# Patient Record
Sex: Female | Born: 1950 | Race: Black or African American | Hispanic: No | Smoking: Never smoker
Health system: Southern US, Community
[De-identification: ages and names within clinical notes are randomized; demographics above are authoritative.]

## PROBLEM LIST (undated history)

## (undated) DIAGNOSIS — F32A Depression, unspecified: Secondary | ICD-10-CM

## (undated) DIAGNOSIS — F329 Major depressive disorder, single episode, unspecified: Secondary | ICD-10-CM

## (undated) DIAGNOSIS — N200 Calculus of kidney: Secondary | ICD-10-CM

## (undated) DIAGNOSIS — M8580 Other specified disorders of bone density and structure, unspecified site: Secondary | ICD-10-CM

## (undated) DIAGNOSIS — I10 Essential (primary) hypertension: Secondary | ICD-10-CM

## (undated) DIAGNOSIS — D649 Anemia, unspecified: Secondary | ICD-10-CM

## (undated) HISTORY — DX: Depression, unspecified: F32.A

## (undated) HISTORY — DX: Essential (primary) hypertension: I10

## (undated) HISTORY — DX: Other specified disorders of bone density and structure, unspecified site: M85.80

## (undated) HISTORY — DX: Major depressive disorder, single episode, unspecified: F32.9

## (undated) HISTORY — PX: LITHOTRIPSY: SUR834

## (undated) HISTORY — DX: Anemia, unspecified: D64.9

## (undated) HISTORY — DX: Calculus of kidney: N20.0

---

## 2008-07-22 ENCOUNTER — Ambulatory Visit (HOSPITAL_COMMUNITY): Admission: RE | Admit: 2008-07-22 | Discharge: 2008-07-22 | Payer: Self-pay | Admitting: Urology

## 2008-08-26 ENCOUNTER — Ambulatory Visit (HOSPITAL_COMMUNITY): Admission: RE | Admit: 2008-08-26 | Discharge: 2008-08-26 | Payer: Self-pay | Admitting: Urology

## 2008-08-26 IMAGING — CR DG ABDOMEN 1V
1 series · 1 of 1 positions shown · non-contrast
Comparison: [DATE]

CLINICAL DATA: Pre lithotripsy.  Right-sided proximal stone.

ABDOMEN - 1 VIEW

[t abdomen supine]
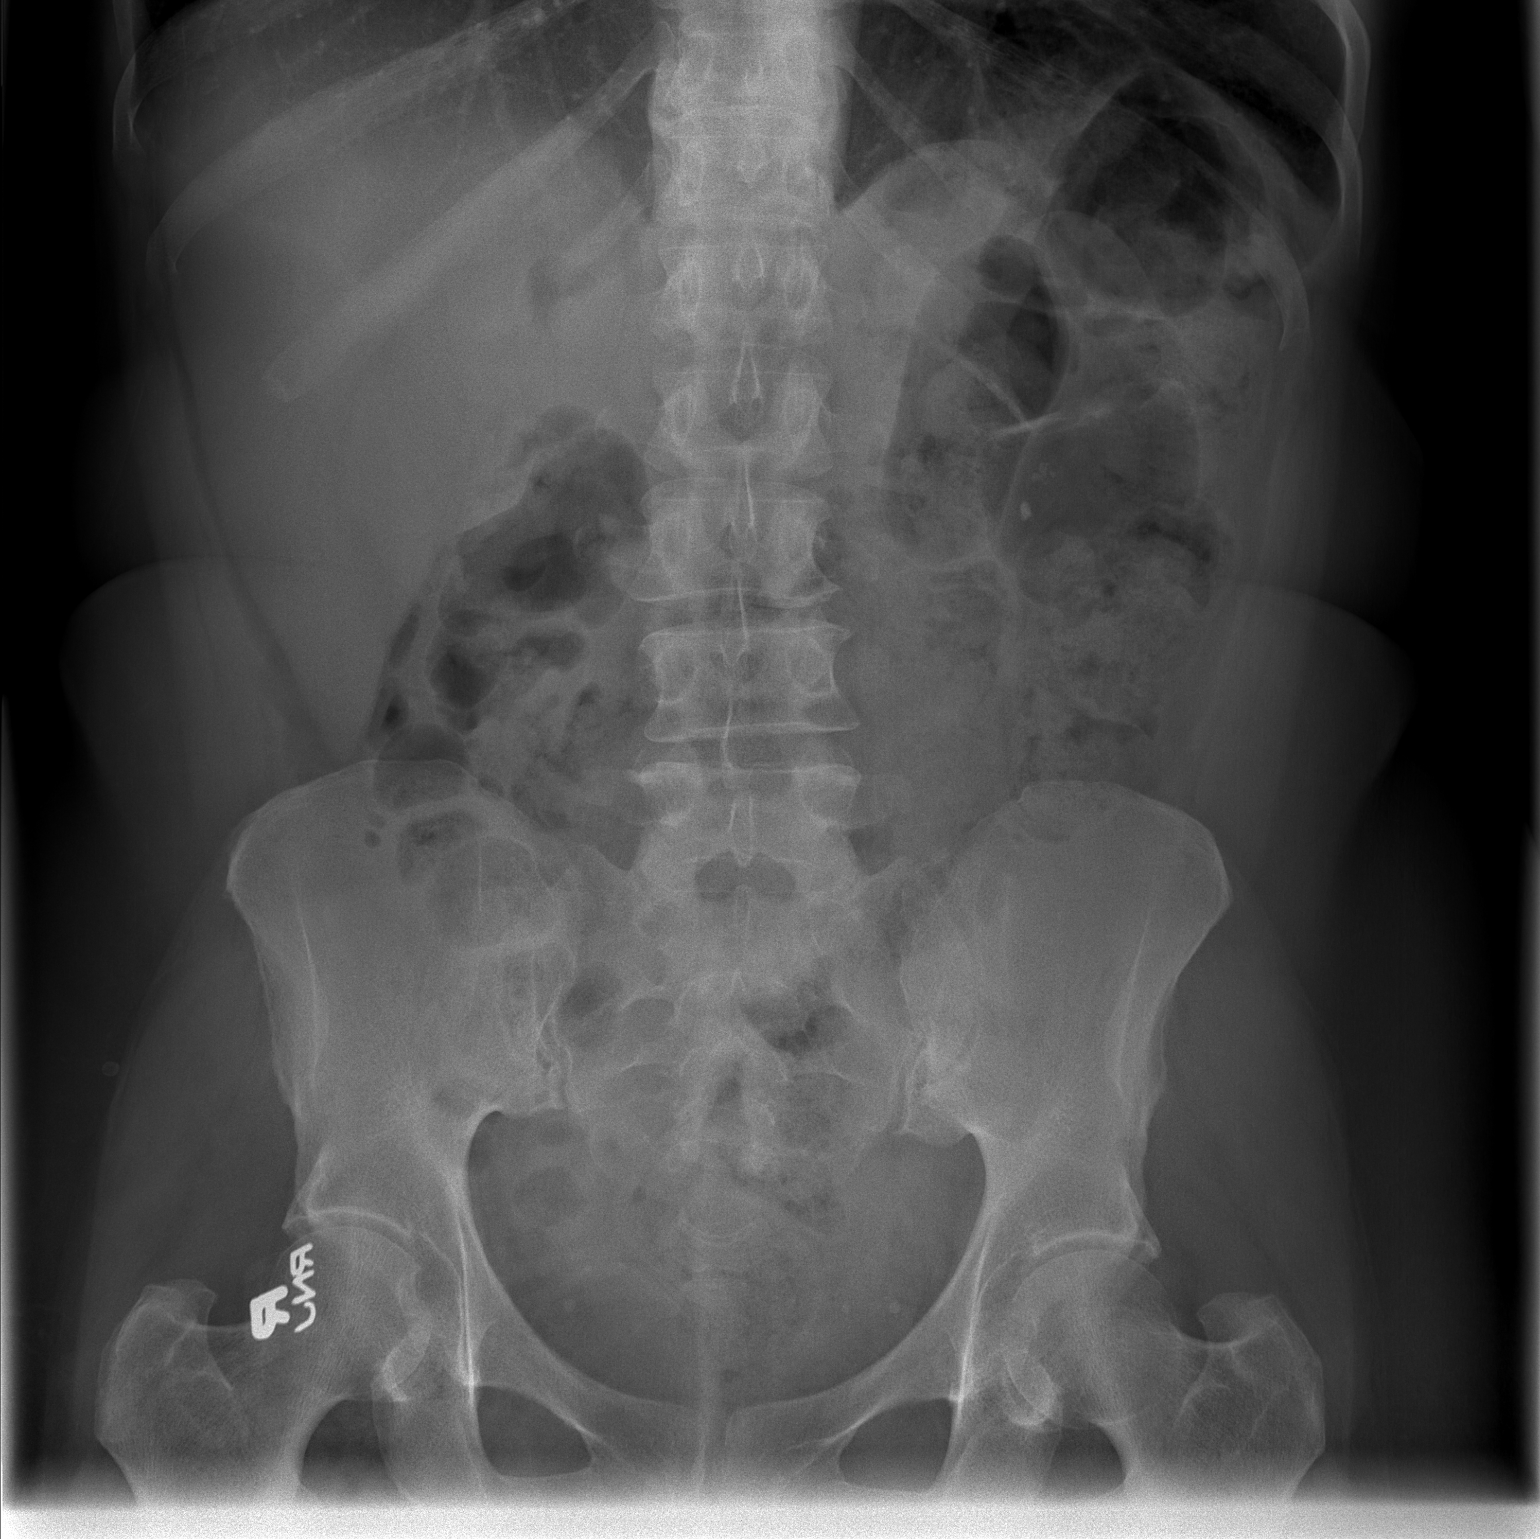

[1 of 1 positions shown; findings below may reference images not displayed]

FINDINGS: Single AP view of the abdomen and pelvis.  Nonobstructive
bowel gas pattern.  There is a large amount of bowel gas overlying
the lower pole of the right kidney and entire left kidney.  The
previously described dense right UPJ calculus is not readily
apparent.  The smaller calculi projecting over the right renal
collecting system are likely present but less well visualized.
There is vague increased density over the right transverse process
of L3 which may represent a proximal ureteric calculus.

Phleboliths identified within the pelvis.  Numerous small stones
over the inter/lower pole left kidney.
IMPRESSION: Extensive bowel gas over the kidneys bilaterally which limits
evaluation.  The dominant stone projecting over the right UPJ on
the prior exam is not readily identified.  Smaller collecting
system calculi are likely similar.

## 2008-09-10 ENCOUNTER — Encounter: Admission: RE | Admit: 2008-09-10 | Discharge: 2008-09-10 | Payer: Self-pay | Admitting: Family Medicine

## 2008-09-10 LAB — HM MAMMOGRAPHY: HM Mammogram: NORMAL

## 2010-02-24 LAB — HM PAP SMEAR: HM Pap smear: NORMAL

## 2010-09-06 ENCOUNTER — Encounter: Payer: Self-pay | Admitting: Family Medicine

## 2010-09-06 DIAGNOSIS — I10 Essential (primary) hypertension: Secondary | ICD-10-CM | POA: Insufficient documentation

## 2010-09-06 DIAGNOSIS — N200 Calculus of kidney: Secondary | ICD-10-CM | POA: Insufficient documentation

## 2012-10-20 ENCOUNTER — Encounter: Payer: Self-pay | Admitting: Physician Assistant

## 2012-10-20 ENCOUNTER — Telehealth: Payer: Self-pay | Admitting: Gastroenterology

## 2012-10-20 ENCOUNTER — Ambulatory Visit (INDEPENDENT_AMBULATORY_CARE_PROVIDER_SITE_OTHER): Payer: BC Managed Care – PPO | Admitting: Physician Assistant

## 2012-10-20 VITALS — BP 132/76 | HR 88 | Temp 99.4°F | Resp 18 | Wt 178.0 lb

## 2012-10-20 DIAGNOSIS — N390 Urinary tract infection, site not specified: Secondary | ICD-10-CM

## 2012-10-20 DIAGNOSIS — I10 Essential (primary) hypertension: Secondary | ICD-10-CM

## 2012-10-20 DIAGNOSIS — R42 Dizziness and giddiness: Secondary | ICD-10-CM

## 2012-10-20 DIAGNOSIS — R1013 Epigastric pain: Secondary | ICD-10-CM

## 2012-10-20 DIAGNOSIS — R197 Diarrhea, unspecified: Secondary | ICD-10-CM

## 2012-10-20 DIAGNOSIS — D649 Anemia, unspecified: Secondary | ICD-10-CM

## 2012-10-20 DIAGNOSIS — R110 Nausea: Secondary | ICD-10-CM

## 2012-10-20 LAB — COMPLETE METABOLIC PANEL WITH GFR
ALT: 89 U/L — ABNORMAL HIGH (ref 0–35)
AST: 58 U/L — ABNORMAL HIGH (ref 0–37)
Albumin: 3.6 g/dL (ref 3.5–5.2)
Alkaline Phosphatase: 198 U/L — ABNORMAL HIGH (ref 39–117)
BUN: 8 mg/dL (ref 6–23)
CO2: 23 mEq/L (ref 19–32)
Calcium: 8.8 mg/dL (ref 8.4–10.5)
Chloride: 102 mEq/L (ref 96–112)
Creat: 1 mg/dL (ref 0.50–1.10)
GFR, Est African American: 70 mL/min
GFR, Est Non African American: 61 mL/min
Glucose, Bld: 126 mg/dL — ABNORMAL HIGH (ref 70–99)
Potassium: 3.8 mEq/L (ref 3.5–5.3)
Sodium: 136 mEq/L (ref 135–145)
Total Bilirubin: 0.8 mg/dL (ref 0.3–1.2)
Total Protein: 6.3 g/dL (ref 6.0–8.3)

## 2012-10-20 LAB — CBC W/MCH & 3 PART DIFF
HCT: 27.7 % — ABNORMAL LOW (ref 36.0–46.0)
Hemoglobin: 8.9 g/dL — CL (ref 12.0–15.0)
Lymphocytes Relative: 17 % (ref 12–46)
Lymphs Abs: 1.7 10*3/uL (ref 0.7–4.0)
MCH: 29.6 pg (ref 26.0–34.0)
MCHC: 32.1 g/dL (ref 30.0–36.0)
MCV: 92 fL (ref 78.0–100.0)
Neutro Abs: 7.1 10*3/uL (ref 1.7–7.7)
Neutrophils Relative %: 74 % (ref 43–77)
Platelets: 216 10*3/uL (ref 150–400)
RBC: 3.01 MIL/uL — ABNORMAL LOW (ref 3.87–5.11)
RDW: 13.5 % (ref 11.5–15.5)
WBC mixed population %: 9 % (ref 3–18)
WBC mixed population: 0.9 10*3/uL (ref 0.1–1.8)
WBC: 9.7 10*3/uL (ref 4.0–10.5)

## 2012-10-20 MED ORDER — OMEPRAZOLE 20 MG PO CPDR: 20.0000 mg | DELAYED_RELEASE_CAPSULE | Freq: Every day | ORAL | Status: DC

## 2012-10-20 MED ORDER — PROMETHAZINE HCL 25 MG PO TABS: 25.0000 mg | ORAL_TABLET | Freq: Three times a day (TID) | ORAL | Status: DC | PRN

## 2012-10-20 MED ORDER — SODIUM CHLORIDE 0.9 % IV SOLN
INTRAVENOUS | Status: DC
  Administered 2012-10-20: 1000 mL via INTRAVENOUS

## 2012-10-20 NOTE — Telephone Encounter (Signed)
Patient will come in tomorrow with Doug Sou, PA at 9:30

## 2012-10-20 NOTE — Progress Notes (Signed)
Patient ID: KIRAH STICE MRN: [id number], DOB: 06-25-1950, 62 y.o. Date of Encounter: @DATE @  Chief Complaint:  Chief Complaint  Patient presents with  . seen in [facility] Saturday,  Dx UTI, anemia, dehydrated    getting worse,  stung by multiple bees last week  question if all this stemmming from that    HPI: 62 y.o. year old AA female  presents with her daughter for followup. She went to the urgent care in [place] on Saturday 10/18/2012.  At that time she reported vomiting diarrhea dark urine.  Her abdominal pain had started sudden onset 3 days prior which would've made at 10/15/2012. Since then she had had some burning pain in her upper abdomen. As well she has noticed dark urine diarrhea fever nausea and vomiting.  She reported a history of anemia and reported that she was supposed to be taking iron pills but was not. She reported no hematochezia and no melena.  Today I reviewed all of this history with her and the daughter and may agree with the above.  At the urgent care on 10/18/12 the following tests were done: CBC: WBC 12.7 Hemoglobin 9.7 hematocrit 30.6 platelet normal. MCV normal at 92. MCH normal at 29.1. C. met was normal including a potassium of 3.6 and a BUN of 16 A urine was cloudy this showed moderate blood. There was positive nitrites and trace leukocytes.  Hemoccult was negative.  She was treated at the urgent care with 1 g of IM Rocephin. She was given a prescription for Cipro for 5 days to start the following day. As well she was given a sublingual Phenergan. The urgent care but apparently not a prescription of nausea medicine for home use. Today she reports that she is still felt very nauseous but has not actually vomited any since being at urgent care. However she's continued to have diarrhea. She was "up all night "with repetitive diarrhea. She is feeling weak and feeling lightheaded and presyncope. She states that prior to going to the urgent care  she vomited 4-5 times.   Past Medical History  Diagnosis Date  . Hypertension   . Depression   . Nephrolithiasis      Home Meds: See attached medication section for current medication list. Any medications entered into computer today will not appear on this note's list. The medications listed below were entered prior to today. Current Outpatient Prescriptions on File Prior to Visit  Medication Sig Dispense Refill  . amLODipine (NORVASC) 5 MG tablet Take 5 mg by mouth daily.        . citalopram (CELEXA) 20 MG tablet Take 20 mg by mouth daily.        . clonazePAM (KLONOPIN) 0.5 MG tablet Take 0.5 mg by mouth 2 (two) times daily as needed.        . hydrochlorothiazide 25 MG tablet Take 25 mg by mouth daily.         No current facility-administered medications on file prior to visit.    Allergies:  Allergies  Allergen Reactions  . Benazepril     History   Social History  . Marital Status: Married    Spouse Name: N/A    Number of Children: N/A  . Years of Education: N/A   Occupational History  . Not on file.   Social History Main Topics  . Smoking status: Not on file  . Smokeless tobacco: Not on file  . Alcohol Use: Not on file  . Drug Use:  Not on file  . Sexual Activity: Not on file   Other Topics Concern  . Not on file   Social History Narrative  . No narrative on file    No family history on file.   Review of Systems:  See HPI for pertinent ROS. All other ROS negative.    Physical Exam: Blood pressure 132/76, pulse 88, temperature 99.4 F (37.4 C), temperature source Oral, resp. rate 18, weight 178 lb (80.74 kg)., There is no height on file to calculate BMI. General: Well-nourished well-developed female appears in no acute distress .Appears in no acute distress. Lungs: Clear bilaterally to auscultation without wheezes, rales, or rhonchi. Breathing is unlabored. Heart: RRR with S1 S2. No murmurs, rubs, or gallops. Abdomen: Soft, non-distended with  normoactive bowel sounds. No hepatomegaly. No rebound/guarding. No obvious abdominal masses. There is tenderness with palpation across the entire upper abdomen but most significant in the mid epigastric area. Musculoskeletal:  Strength and tone normal for age. Extremities/Skin: Warm and dry. No clubbing or cyanosis. No edema. No rashes or suspicious lesions. Neuro: Alert and oriented X 3. Moves all extremities spontaneously. Gait is normal. CNII-XII grossly in tact. Psych:  Responds to questions appropriately with a normal affect. Digital Rectal Exam: Normal.  Hemoocult: NEGATIVE   Results for orders placed in visit on 10/20/12  CBC [facility] & 3 PART DIFF      Result Value Range   WBC 9.7  4.0 - 10.5 K/uL   RBC 3.01 (*) 3.87 - 5.11 MIL/uL   Hemoglobin 8.9 (*) 12.0 - 15.0 g/dL   HCT 96.0 (*) 45.4 - 09.8 %   MCV 92.0  78.0 - 100.0 fL   MCH 29.6  26.0 - 34.0 pg   MCHC 32.1  30.0 - 36.0 g/dL   RDW 11.9  14.7 - 82.9 %   Platelets 216  150 - 400 K/uL   Neutrophils Relative % 74  43 - 77 %   Neutro Abs 7.1  1.7 - 7.7 K/uL   Lymphocytes Relative 17  12 - 46 %   Lymphs Abs 1.7  0.7 - 4.0 K/uL   WBC mixed population % 9  3 - 18 %   WBC mixed population 0.9  0.1 - 1.8 K/uL  COMPLETE METABOLIC PANEL WITH GFR      Result Value Range   Sodium    135 - 145 mEq/L   Potassium    3.5 - 5.3 mEq/L   Chloride    96 - 112 mEq/L   CO2    19 - 32 mEq/L   Glucose, Bld    70 - 99 mg/dL   BUN    6 - 23 mg/dL   Creat    5.62 - 1.30 mg/dL   Total Bilirubin    0.3 - 1.2 mg/dL   Alkaline Phosphatase    39 - 117 U/L   AST    0 - 37 U/L   ALT    0 - 53 U/L   Total Protein    6.0 - 8.3 g/dL   Albumin    3.5 - 5.2 g/dL   Calcium    8.4 - 86.5 mg/dL   GFR, Est African American       GFR, Est Non African American         ASSESSMENT AND PLAN:  62 y.o. year old female with  1. Anemia Review of her chart. There is no prior CBC in the Epic computer system. The last CBC  and her paper chart is from 06/01/2008. At  that time her hemoglobin was 10.7 hematocrit 34.0. MCV and MCH were normal at that time. Anemia panel was done at that time showing normal iron levels and normal B12 and folate. Her Hemoccult tests were negative x3 there is no CBC since that date.  Given her epigastric pain we'll recheck a CBC white now to make sure hemoglobin is stable compared to the reading at the urgent care. However I think her epigastric pain may just be secondary to her recent vomiting. - CBC w/MCH & 3 Part Diff  Hemoglobin and hematocrit at the urgent care were 9.11/30.6. Today they are reading 8.9/27.7. Hemoccult was negative at the urgent care and is negative again today. However I am concerned about his anemia and especially the fact that it is worsening. We'll start proton pump inhibitor as above we'll also go ahead and refer to GI. She has never seen a GI before but her daughter has seen Dr. Arlyce Dice L. of our GI and prefers that pt go there/ Wil refer thaer on urgent bsis   2. Hypertension She is supposed to be on HCTZ in addition to Norvasc. However she realizes that she is accidentally been off of the HCTZ. I have told her to stay off of this until all the symptoms resolved.  3. UTI (urinary tract infection) She was given 1 g Rocephin at the urgent care. She is now on oral Cipro. Complete this. We'll recheck CBC to make sure that this is stable or improving - CBC w/MCH & 3 Part Diff  4. Abdominal pain, epigastric Will check labs as above. We'll go ahead and treat with PPI. Avoid any NSAIDs. Use Phenergan as needed. - CBC w/MCH & 3 Part Diff - COMPLETE METABOLIC PANEL WITH GFR - omeprazole (PRILOSEC) 20 MG capsule; Take 1 capsule (20 mg total) by mouth daily.  Dispense: 30 capsule; Refill: 3 - promethazine (PHENERGAN) 25 MG tablet; Take 1 tablet (25 mg total) by mouth every 8 (eight) hours as needed for nausea.  Dispense: 20 tablet; Refill: 0  5. Nausea alone She did have 5 episodes of vomiting at the beginning  of this but has had no vomiting since being at the urgent care on 10/18/2012. - CBC w/MCH & 3 Part Diff - COMPLETE METABOLIC PANEL WITH GFR - promethazine (PHENERGAN) 25 MG tablet; Take 1 tablet (25 mg total) by mouth every 8 (eight) hours as needed for nausea.  Dispense: 20 tablet; Refill: 0  6. Diarrhea She is continuing to have multiple episodes of diarrhea. We'll recheck labs to make sure these are stable. We'll treat with IV fluids now to you replete volume loss. - CBC w/MCH & 3 Part Diff - COMPLETE METABOLIC PANEL WITH GFR  7. Lightheadedness We'll treat with IV fluids to replete volume loss or diarrhea. - COMPLETE METABOLIC PANEL WITH GFR  Was negative at the urgent care. We'll recheck CBC and cemented now. Will treat with IV fluids. We'll give Phenergan and proton pump inhibitor. Avoid NSAIDs. She must stay on a clear liquid diet for 48 hours and then advance to a bland diet of crackers and toes. The daughter reports that yesterday the patient ate beef liver. I discussed that her intestines and stomach are  irritatated and inflamed and she must avoid eating solid foods that have to be digested.  Signed, [address] [place], [place], [facility] 10/20/2012 9:12 AM

## 2012-10-21 ENCOUNTER — Encounter: Payer: Self-pay | Admitting: Internal Medicine

## 2012-10-21 ENCOUNTER — Encounter: Payer: Self-pay | Admitting: *Deleted

## 2012-10-21 ENCOUNTER — Telehealth: Payer: Self-pay | Admitting: Nurse Practitioner

## 2012-10-21 ENCOUNTER — Ambulatory Visit (INDEPENDENT_AMBULATORY_CARE_PROVIDER_SITE_OTHER): Payer: BC Managed Care – PPO | Admitting: Nurse Practitioner

## 2012-10-21 VITALS — BP 132/68 | HR 74 | Ht 62.0 in | Wt 179.0 lb

## 2012-10-21 DIAGNOSIS — D649 Anemia, unspecified: Secondary | ICD-10-CM

## 2012-10-21 DIAGNOSIS — R630 Anorexia: Secondary | ICD-10-CM | POA: Insufficient documentation

## 2012-10-21 DIAGNOSIS — R195 Other fecal abnormalities: Secondary | ICD-10-CM

## 2012-10-21 MED ORDER — MOVIPREP 100 G PO SOLR: 1.0000 | Freq: Once | ORAL | Status: DC

## 2012-10-21 NOTE — Progress Notes (Addendum)
HPI :  Patient is a 62 year old female referred for evaluation of anemia. She was evaluated at an urgent center in [place], [place] 10/18/12 for gastroenteritis type symptoms  Her white count was mildly elevated at 12.7, hgb 9.7. Alk phos was normal but bili elevated at 2.4 (0.9 direct), AST 49 and ALT elevated at 110. Urinalysis with positive nitrites, moderate blood, and trace leukocytes. Stool for blood was negative.  She was given IV fluids, IM Rocephin, and a prescription for cipro and zofran. Her GI symptoms have improved. She is on a soft diet now. Patient saw her PCP in followup yesterday. Repeat CBC pertinent for hemoglobin of 8.9. Her white count had normalized. LFTs improved with AST 58, ALT 89. Total bilirubin down to 0.8. Alk phos now elevated at 198. She was heme negative on PCP's rectal exam.  Patient has no in EPIC other than yesterday's labs.We did call PCP and in 2010 her hemoglobin was 10.7. B12 and folate normal. Ferritin was 279. TIBC normal at 278 with 26% saturation.  Patient reports a poor appetite over last 2 months but no weight loss. No nausea. She does give a history of IBS with alternating constipation and diarrhea depending on diet but her BMs are at baseline. No overt bleeding. No GERD. She takes Ibuprofen daily for back pain.    Past Medical History  Diagnosis Date  . Hypertension   . Depression   . Nephrolithiasis   . Anemia     Family History  Problem Relation Age of Onset  . Cerebrovascular Accident     History  Substance Use Topics  . Smoking status: Never Smoker   . Smokeless tobacco: Never Used  . Alcohol Use: No   Current Outpatient Prescriptions  Medication Sig Dispense Refill  . amLODipine (NORVASC) 5 MG tablet Take 5 mg by mouth daily.        . ciprofloxacin (CIPRO) 250 MG tablet Take 250 mg by mouth 2 (two) times daily.      Marland Kitchen omeprazole (PRILOSEC) 20 MG capsule Take 1 capsule (20 mg total) by mouth daily.  30 capsule  3  . promethazine  (PHENERGAN) 25 MG tablet Take 1 tablet (25 mg total) by mouth every 8 (eight) hours as needed for nausea.  20 tablet  0  . citalopram (CELEXA) 20 MG tablet Take 20 mg by mouth daily.        . clonazePAM (KLONOPIN) 0.5 MG tablet Take 0.5 mg by mouth 2 (two) times daily as needed.        . hydrochlorothiazide 25 MG tablet Take 25 mg by mouth daily.         Current Facility-Administered Medications  Medication Dose Route Frequency Provider Last Rate Last Dose  . 0.9 %  sodium chloride infusion   Intravenous Continuous Dorena Bodo, PA-C 150 mL/hr at 10/20/12 0941 1,000 mL at 10/20/12 0941   Allergies  Allergen Reactions  . Benazepril     Review of Systems: Positive for anxiety, arthritis, back pain and urine leakage  All other  systems reviewed and negative except where noted in HPI.   Physical Exam: BP 132/68  Pulse 74  Ht 5\' 2"  (1.575 m)  Wt 179 lb (81.194 kg)  BMI 32.73 kg/m2 Constitutional: Pleasant,well-developed, black female in no acute distress. HEENT: Normocephalic and atraumatic. Conjunctivae are normal. No scleral icterus. Neck supple.  Cardiovascular: Normal rate, regular rhythm.  Pulmonary/chest: Effort normal and breath sounds normal. No wheezing, rales or rhonchi. Abdominal: Soft,  nondistended, nontender. Bowel sounds active throughout. There are no masses palpable. No hepatomegaly. Extremities: no edema Lymphadenopathy: No cervical adenopathy noted. Neurological: Alert and oriented to person place and time. Skin: Skin is warm and dry. No rashes noted. Psychiatric: Normal mood and affect. Behavior is normal.   ASSESSMENT AND PLAN:  34. 62 year old female with acute on chronic normocytic anemia. Patient heme negative on two recent occasions but given NSAID use it is reasonable to proceed with an EGD. She has never had a screening colonoscopy so will proceed with that as well.  The risk and benefits of these procedures which could include biopsies/polypectomy were  discussed with the patient and she agrees to proceed. Until time of procedure advised patient to discontinue ibuprofen and continue PPI before breakfast as prescribed by PCP yesterday.  If EGD/ colonoscopy negative consider hemolysis workup.   2. abnormal LFTs. Mild elevation of direct and indirect bili and elevated transaminases. Consider hemolysis with moderate urobilinogen in urine. LFTs improved except for alk phos which interestingly has risen from normal to 198. Of note LFTs were normal in 2010.  Patient should have repeat LFTs in two weeks,  If still abnormal further workup will be needed.   3. ? UTI. Diagnosed in [place]. On Cipro. Nitrites were positive though if dipstick method used nitrites may be falsely positive secondary to discolored urine (urine was orange).    Addendum: Reviewed and agree with initial management. Beverley Fiedler, MD

## 2012-10-21 NOTE — Telephone Encounter (Signed)
The patient didn't know [facility] were [facility] in that area.  The patient told me to send the perscription  to  [address] [place], phone # [phone].

## 2012-10-21 NOTE — Patient Instructions (Addendum)
You have been scheduled for an endoscopy and colonoscopy with propofol. Please follow the written instructions given to you at your visit today. Please pick up your prep at the pharmacy within the next 1-3 days. [facility] [address] [facility], [place], [place]. If you use inhalers (even only as needed), please bring them with you on the day of your procedure.

## 2012-10-22 ENCOUNTER — Encounter: Payer: Self-pay | Admitting: Nurse Practitioner

## 2012-10-23 ENCOUNTER — Ambulatory Visit (AMBULATORY_SURGERY_CENTER): Payer: BC Managed Care – PPO | Admitting: Internal Medicine

## 2012-10-23 ENCOUNTER — Encounter: Payer: Self-pay | Admitting: Internal Medicine

## 2012-10-23 ENCOUNTER — Ambulatory Visit: Payer: BC Managed Care – PPO | Admitting: Physician Assistant

## 2012-10-23 VITALS — BP 157/93 | HR 75 | Temp 98.6°F | Resp 20 | Ht 62.0 in | Wt 179.0 lb

## 2012-10-23 DIAGNOSIS — Z1211 Encounter for screening for malignant neoplasm of colon: Secondary | ICD-10-CM

## 2012-10-23 DIAGNOSIS — D126 Benign neoplasm of colon, unspecified: Secondary | ICD-10-CM

## 2012-10-23 DIAGNOSIS — D649 Anemia, unspecified: Secondary | ICD-10-CM

## 2012-10-23 DIAGNOSIS — D131 Benign neoplasm of stomach: Secondary | ICD-10-CM

## 2012-10-23 MED ORDER — SODIUM CHLORIDE 0.9 % IV SOLN: 500.0000 mL | INTRAVENOUS | Status: DC

## 2012-10-23 NOTE — Op Note (Signed)
[facility] [address] [place], [address]   ENDOSCOPY PROCEDURE REPORT  PATIENT: Gina Levine, Gina Levine  MR#: [id number] BIRTHDATE: 1950-03-04 , 61  yrs. old GENDER: Female ENDOSCOPIST: Beverley Fiedler, MD PROCEDURE DATE:  10/23/2012 PROCEDURE:  EGD w/ biopsy for H.pylori ASA CLASS:     Class III INDICATIONS:  Anemia. MEDICATIONS: MAC sedation, administered by CRNA and propofol (Diprivan) 150mg  IV TOPICAL ANESTHETIC: Cetacaine Spray  DESCRIPTION OF PROCEDURE: After the risks benefits and alternatives of the procedure were thoroughly explained, informed consent was obtained.  The LB [id number] endoscope was introduced through the mouth and advanced to the second portion of the duodenum. Without limitations.  The instrument was slowly withdrawn as the mucosa was fully examined.    ESOPHAGUS: The mucosa of the esophagus appeared normal.  STOMACH: Mild acute gastritis (inflammation) was found in the prepyloric region of the stomach.  Biopsies were taken in the antrum and angularis.  DUODENUM: The duodenal mucosa showed no abnormalities in the bulb and second portion of the duodenum.  Retroflexed views revealed no abnormalities.     The scope was then withdrawn from the patient and the procedure completed.  COMPLICATIONS: There were no complications.  ENDOSCOPIC IMPRESSION: 1.   The mucosa of the esophagus appeared normal 2.   Mild acute gastritis (inflammation) was found in the prepyloric region of the stomach; biopsies were taken in the antrum and angularis 3.   The duodenal mucosa showed no abnormalities in the bulb and second portion of the duodenum  RECOMMENDATIONS: 1.  Await pathology results 2.  Colonoscopy today  eSigned:  Beverley Fiedler, MD 10/23/2012 3:10 PM   CC:The Patient and Lynnea Ferrier, MD

## 2012-10-23 NOTE — Progress Notes (Signed)
Called to room to assist during endoscopic procedure.  Patient ID and intended procedure confirmed with present staff. Received instructions for my participation in the procedure from the performing physician.  

## 2012-10-23 NOTE — Patient Instructions (Addendum)

## 2012-10-23 NOTE — Progress Notes (Signed)
BP left arm 167/143 on admission.  Fanny Dance, CRNA notified.  BP rechecked in left arm 166/94.    Anesthesia notified.

## 2012-10-23 NOTE — Op Note (Signed)
[facility] [address] [place], [address]   COLONOSCOPY PROCEDURE REPORT  PATIENT: Gina, Levine  MR#: [id number] BIRTHDATE: [birth year] , 61  yrs. old GENDER: Female ENDOSCOPIST: Beverley Fiedler, MD PROCEDURE DATE:  10/23/2012 PROCEDURE:   Colonoscopy with cold biopsy polypectomy First Screening Colonoscopy - Avg.  risk and is 50 yrs.  old or older Yes.  Prior Negative Screening - Now for repeat screening. N/A  History of Adenoma - Now for follow-up colonoscopy & has been > or = to 3 yrs.  N/A  Polyps Removed Today? Yes. ASA CLASS:   Class III INDICATIONS:Anemia, non-specific and average risk screening. MEDICATIONS: MAC sedation, administered by CRNA and propofol (Diprivan) 200mg  IV  DESCRIPTION OF PROCEDURE:   After the risks benefits and alternatives of the procedure were thoroughly explained, informed consent was obtained.  A digital rectal exam revealed no rectal mass.   The LB [id number]  endoscope was introduced through the anus and advanced to the cecum, which was identified by both the appendix and ileocecal valve. No adverse events experienced. The quality of the prep was good, using MoviPrep  The instrument was then slowly withdrawn as the colon was fully examined.    COLON FINDINGS: A sessile polyp measuring 3 mm in size was found in the ascending colon.  A polypectomy was performed with cold forceps.  The resection was complete and the polyp tissue was completely retrieved.   The colon was otherwise normal.  There was no diverticulosis, inflammation, polyps or cancers unless previously stated.  Retroflexed views revealed no abnormalities. The time to cecum=1 minutes 59 seconds.  Withdrawal time=9 minutes 22 seconds.  The scope was withdrawn and the procedure completed.  COMPLICATIONS: There were no complications.  ENDOSCOPIC IMPRESSION: 1.   Sessile polyp measuring 3 mm in size was found in the ascending colon; polypectomy was  performed with cold forceps 2.   The colon was otherwise normal  RECOMMENDATIONS: 1.  Await biopsy results 2.  Monitor Hgb.  If blood count fails to normalize, would consider hematology referral 3.  If the polyp removed today is proven to be an adenomatous (pre-cancerous) polyp, you will need a repeat colonoscopy in 5 years.  Otherwise you should continue to follow colorectal cancer screening guidelines for "routine risk" patients with colonoscopy in 10 years.  You will receive a letter within 1-2 weeks with the results of your biopsy as well as final recommendations.  Please call my office if you have not received a letter after 3 weeks.   eSigned:  Beverley Fiedler, MD 10/23/2012 3:14 PM      cc: Lynnea Ferrier, MD

## 2012-10-23 NOTE — Progress Notes (Signed)
Patient did not experience any of the following events: a burn prior to discharge; a fall within the facility; wrong site/side/patient/procedure/implant event; or a hospital transfer or hospital admission upon discharge from the facility. (G8907) Patient did not have preoperative order for IV antibiotic SSI prophylaxis. (G8918)  

## 2012-10-24 ENCOUNTER — Telehealth: Payer: Self-pay | Admitting: *Deleted

## 2012-10-24 NOTE — Telephone Encounter (Signed)
  Follow up Call-  Call back number 10/23/2012  Post procedure Call Back phone  # [phone]  Permission to leave phone message Yes     Patient questions:  Message left to call if necessary.

## 2012-10-28 ENCOUNTER — Encounter: Payer: Self-pay | Admitting: Internal Medicine

## 2012-11-19 ENCOUNTER — Encounter: Payer: Self-pay | Admitting: Physician Assistant

## 2012-11-19 ENCOUNTER — Ambulatory Visit (INDEPENDENT_AMBULATORY_CARE_PROVIDER_SITE_OTHER): Payer: BC Managed Care – PPO | Admitting: Physician Assistant

## 2012-11-19 VITALS — BP 144/96 | HR 76 | Temp 98.0°F | Resp 18 | Ht 62.5 in | Wt 175.0 lb

## 2012-11-19 DIAGNOSIS — D649 Anemia, unspecified: Secondary | ICD-10-CM

## 2012-11-19 DIAGNOSIS — R319 Hematuria, unspecified: Secondary | ICD-10-CM

## 2012-11-19 DIAGNOSIS — Z0000 Encounter for general adult medical examination without abnormal findings: Secondary | ICD-10-CM

## 2012-11-19 DIAGNOSIS — R7989 Other specified abnormal findings of blood chemistry: Secondary | ICD-10-CM

## 2012-11-19 DIAGNOSIS — Z23 Encounter for immunization: Secondary | ICD-10-CM

## 2012-11-19 DIAGNOSIS — I10 Essential (primary) hypertension: Secondary | ICD-10-CM

## 2012-11-19 LAB — CBC WITH DIFFERENTIAL/PLATELET
Basophils Absolute: 0.1 10*3/uL (ref 0.0–0.1)
Basophils Relative: 1 % (ref 0–1)
Eosinophils Absolute: 0.3 10*3/uL (ref 0.0–0.7)
Eosinophils Relative: 5 % (ref 0–5)
HCT: 31.3 % — ABNORMAL LOW (ref 36.0–46.0)
Hemoglobin: 10.1 g/dL — ABNORMAL LOW (ref 12.0–15.0)
Lymphocytes Relative: 45 % (ref 12–46)
Lymphs Abs: 2.6 10*3/uL (ref 0.7–4.0)
MCH: 29 pg (ref 26.0–34.0)
MCHC: 32.3 g/dL (ref 30.0–36.0)
MCV: 89.9 fL (ref 78.0–100.0)
Monocytes Absolute: 0.4 10*3/uL (ref 0.1–1.0)
Monocytes Relative: 7 % (ref 3–12)
Neutro Abs: 2.4 10*3/uL (ref 1.7–7.7)
Neutrophils Relative %: 42 % — ABNORMAL LOW (ref 43–77)
Platelets: 281 10*3/uL (ref 150–400)
RBC: 3.48 MIL/uL — ABNORMAL LOW (ref 3.87–5.11)
RDW: 14.6 % (ref 11.5–15.5)
WBC: 5.7 10*3/uL (ref 4.0–10.5)

## 2012-11-19 LAB — URINALYSIS, ROUTINE W REFLEX MICROSCOPIC
Bilirubin Urine: NEGATIVE
Glucose, UA: NEGATIVE mg/dL
Ketones, ur: NEGATIVE mg/dL
Nitrite: NEGATIVE
Protein, ur: NEGATIVE mg/dL
Specific Gravity, Urine: 1.015 (ref 1.005–1.030)
Urobilinogen, UA: 0.2 mg/dL (ref 0.0–1.0)
pH: 5.5 (ref 5.0–8.0)

## 2012-11-19 LAB — COMPLETE METABOLIC PANEL WITH GFR
ALT: 9 U/L (ref 0–35)
AST: 14 U/L (ref 0–37)
Albumin: 4.5 g/dL (ref 3.5–5.2)
Alkaline Phosphatase: 82 U/L (ref 39–117)
BUN: 13 mg/dL (ref 6–23)
CO2: 27 mEq/L (ref 19–32)
Calcium: 9.8 mg/dL (ref 8.4–10.5)
Chloride: 104 mEq/L (ref 96–112)
Creat: 1.04 mg/dL (ref 0.50–1.10)
GFR, Est African American: 67 mL/min
GFR, Est Non African American: 58 mL/min — ABNORMAL LOW
Glucose, Bld: 85 mg/dL (ref 70–99)
Potassium: 4.6 mEq/L (ref 3.5–5.3)
Sodium: 138 mEq/L (ref 135–145)
Total Bilirubin: 0.5 mg/dL (ref 0.3–1.2)
Total Protein: 7.2 g/dL (ref 6.0–8.3)

## 2012-11-19 LAB — URINALYSIS, MICROSCOPIC ONLY
Casts: NONE SEEN
Crystals: NONE SEEN

## 2012-11-19 LAB — LIPID PANEL
Cholesterol: 196 mg/dL (ref 0–200)
HDL: 48 mg/dL (ref >39)
LDL Cholesterol: 121 mg/dL — ABNORMAL HIGH (ref 0–99)
Total CHOL/HDL Ratio: 4.1 Ratio
Triglycerides: 134 mg/dL (ref <150)
VLDL: 27 mg/dL (ref 0–40)

## 2012-11-19 LAB — TSH: TSH: 1.201 u[IU]/mL (ref 0.350–4.500)

## 2012-11-19 MED ORDER — HYDROCHLOROTHIAZIDE 25 MG PO TABS: 25.0000 mg | ORAL_TABLET | Freq: Every day | ORAL | Status: DC

## 2012-11-19 NOTE — Progress Notes (Signed)
Patient ID: Gina Levine MRN: [id number], DOB: 03-01-1950, 62 y.o. Date of Encounter: 11/19/2012,   Chief Complaint: Physical (CPE)  HPI: 62 y.o. y/o AA female  here for CPE. Her daughter is with her again today. Pt lives in [place], near [place], [place]. Uses Pharmacy in [place]. Gets Mammogram in [place].  She has no specific complaints today.  I did review her last office visit note with me dated 10/20/12. She had gone to the urgent care in [place] on Saturday 10/18/12. She had reported vomiting, diarrhea, dark urine. As well she had had abdominal pain with sudden onset 63 days prior to that visit. Described it as a burning pain in her upper abdomen. She also reported a history of anemia and reported that she was supposed to be taking iron pills but was not. Reported no hematochezia or melena. At the urgent care on 10/18/12 the following tests were done: CBC had shown WBC 12.7, hemoglobin 9.7, hematocrit 30.6, platelets normal. MCV normal at 92. MCH normal at 29.1 CMET was normal. Urine was cloudy with moderate blood, positive nitrites, trace leukocytes. Hemoccult was negative. She was treated at the urgent care with 1 g of IM Rocephin. Was given prescription for Cipro for 5 days to start the following day.  She saw me on 10/20/12 for followup. Reported that she still felt very nauseous but had not actually vomited since being at urgent care. However she was continuing to have diarrhea. Reported that she was "up all night" with repetitive diarrhea. She was feeling weak with lightheadedness and presyncope. Stated that prior to going to the urgent care she had vomited 4-5 times. At the visit with me her Hemoccult was again negative as it was at the urgent care. We did a blood count in-house which showed hemoglobin 8.9 hematocrit 27.7. WBC remained normal at 9.7. Noted that her hemoglobin was down from just 2 days prior. Referral to GI.  As well a CMET was performed. This was  normal except alkaline phosphatase was elevated at 198, AST elevated at 58, ALT elevated at 89.   She did followup with [facility]. Did both an EGD and a colonoscopy on 10/23/12. Was able to pull up both of these reports as well as pass the pathology reports in the computer today. Colonoscopy showed one polyp. Polypectomy was performed and this was sent for pathology. Remainder of colonoscopy was normal. Report stated that at this polyp was benign and she can have repeat colonoscopy in 10 years. Pathology report does show that this was benign. There was no adenomatous change or malignancy change.  Endoscopy report shows mild acute gastritis. pathology was obtained. Pathology report for this shows that this was benign. Negative for H. pylori he, dysplasia, malignancy. Otherwise endoscopy was negative and normal.  As the patient and the daughter but they were told to do. Patient was told to stop taking ibuprofen. However no new medications were started by GI. There were told that if the anemia persists then she would need hematology referral.  I then asked if GI addressed the elevated LFTs at all. They said that GI did not address the LFTs .  She has had no further nausea, vomiting, diarrhea. Has noticed no melena or hematochezia. Has noticed no hematuria, dysuria frequency or urgency.      Review of Systems: Consitutional: No fever, chills, fatigue, night sweats, lymphadenopathy. No significant/unexplained weight changes. Eyes: No visual changes, eye redness, or discharge. ENT/Mouth: No ear pain, sore throat, nasal drainage,  or sinus pain. Cardiovascular: No chest pressure,heaviness, tightness or squeezing, even with exertion. No increased shortness of breath or dyspnea on exertion.No palpitations, edema, orthopnea, PND. Respiratory: No cough, hemoptysis, SOB, or wheezing. Gastrointestinal: No anorexia, dysphagia, reflux, pain, nausea, vomiting, hematemesis, diarrhea, constipation, BRBPR, or  melena. Breast: No mass, nodules, bulging, or retraction. No skin changes or inflammation. No nipple discharge. No lymphadenopathy. Genitourinary: No dysuria, hematuria, incontinence, vaginal discharge, pruritis, burning, abnormal bleeding, or pain. Musculoskeletal: No decreased ROM, No joint pain or swelling. No significant pain in neck, back, or extremities. Skin: No rash, pruritis, or concerning lesions. Neurological: No headache, dizziness, syncope, seizures, tremors, memory loss, coordination problems, or paresthesias. Psychological: No anxiety, depression, hallucinations, SI/HI. Endocrine: No polydipsia, polyphagia, polyuria, or known diabetes.No increased fatigue. No palpitations/rapid heart rate. No significant/unexplained weight change. All other systems were reviewed and are otherwise negative.  Past Medical History  Diagnosis Date  . Hypertension   . Depression   . Nephrolithiasis   . Anemia      Past Surgical History  Procedure Laterality Date  . Lithotripsy      x 2    Home Meds:  Current Outpatient Prescriptions on File Prior to Visit  Medication Sig Dispense Refill  . amLODipine (NORVASC) 5 MG tablet Take 5 mg by mouth daily.        Marland Kitchen omeprazole (PRILOSEC) 20 MG capsule Take 1 capsule (20 mg total) by mouth daily.  30 capsule  3  . promethazine (PHENERGAN) 25 MG tablet Take 1 tablet (25 mg total) by mouth every 8 (eight) hours as needed for nausea.  20 tablet  0   No current facility-administered medications on file prior to visit.    Allergies:  Allergies  Allergen Reactions  . Benazepril     History   Social History  . Marital Status: Married    Spouse Name: N/A    Number of Children: 4  . Years of Education: N/A   Occupational History  . Retired    Social History Main Topics  . Smoking status: Never Smoker   . Smokeless tobacco: Never Used  . Alcohol Use: No  . Drug Use: No  . Sexual Activity: Not on file   Other Topics Concern  . Not on  file   Social History Narrative  . No narrative on file    Family History  Problem Relation Age of Onset  . Cerebrovascular Accident      Physical Exam: Blood pressure 144/96, pulse 76, temperature 98 F (36.7 C), temperature source Oral, resp. rate 18, height 5' 2.5" (1.588 m), weight 175 lb (79.379 kg)., Body mass index is 31.48 kg/(m^2). General: Well developed, well nourished, AAF. Appears in no acute distress. HEENT: Normocephalic, atraumatic. Conjunctiva pink, sclera non-icteric. Pupils 2 mm constricting to 1 mm, round, regular, and equally reactive to light and accomodation. EOMI. Internal auditory canal clear. TMs with good cone of light and without pathology. Nasal mucosa pink. Nares are without discharge. No sinus tenderness. Oral mucosa pink.  Pharynx without exudate.   Neck: Supple. Trachea midline. No thyromegaly. Full ROM. No lymphadenopathy.No Carotid Bruits. Lungs: Clear to auscultation bilaterally without wheezes, rales, or rhonchi. Breathing is of normal effort and unlabored. Cardiovascular: RRR with S1 S2. No murmurs, rubs, or gallops. Distal pulses 2+ symmetrically. No carotid or abdominal bruits. Breast: Symmetrical. No masses. Nipples without discharge. Abdomen: Soft, non-tender, non-distended with normoactive bowel sounds. No hepatosplenomegaly or masses. No rebound/guarding. No CVA tenderness. No hernias.  Genitourinary:  External  genitalia without lesions. Vaginal mucosa pink.No discharge present. Cervix pink and without discharge. No cervical tenderness.Normal uterus size. No adnexal mass or tenderness.   Musculoskeletal: Full range of motion and 5/5 strength throughout. Without swelling, atrophy, tenderness, crepitus, or warmth. Extremities without clubbing, cyanosis, or edema. Calves supple. Skin: Warm and moist without erythema, ecchymosis, wounds, or rash. Neuro: A+Ox3. CN II-XII grossly intact. Moves all extremities spontaneously. Full sensation throughout.  Normal gait. DTR 2+ throughout upper and lower extremities. Finger to nose intact. Psych:  Responds to questions appropriately with a normal affect.      Assessment/Plan:  62 y.o. y/o female here for CPE 1. Visit for preventive health examination  A. Screening Labs:  - CBC with Differential - COMPLETE METABOLIC PANEL WITH GFR - Lipid panel - TSH - Vit D  25 hydroxy (rtn osteoporosis monitoring)  B. Screening Pap: Nml 2012. Can wait 3-5 years to repeat.  C. Screening Mammogram: She reports that she gets her mammograms performed in the [facility] [place]. Says that the last one was done in July 2013. She is aware that this is overdue. She agrees to go home and call to schedule this.  D. bone density test: She reports that she has never had this done. However I think we can wait another year or 2 then start these. She has never smoked and has not used steroid medications. No increased risk.  E. Colorectal Cancer Screening: He just had colonoscopy 10/23/12. Repeat 10 years.  F. Immunizations: Influenza vaccine: Today. Patient agreeable Tetanus vaccine: Overdue. Patient agreeable to get today. Zostavax: Patient is to call her insurance regarding cost. Then followup with [place] if she is interested in proceeding with this. Pneumovax: We'll give this at age 26.  2. Anemia The history of present illness for details. If Hemoglobin has not increased at all then we will refer her to hematology for evaluation. Actually her urinalysis today still shows some blood. I am sending the urine for culture. If there is active infection then we will treat the infection and will then need to recheck another urinalysis to confirm that this blood has resolved. Otherwise she may need a urology referral rather than hematology referral for evaluation of this hematuria  and anemia. - CBC with Differential  3. Elevated LFTs Repeat now. If still elevated then we'll add hepatitis panel. If this is negative then we'll order  ultrasound of the liver. - COMPLETE METABOLIC PANEL WITH GFR  4. Hypertension At her office visit with me in September 2014 she reported that she had accidentally stopped her HCTZ. However at that time I told her to stay off of it given her vomiting and diarrhea.  However today I will resume this as her blood pressure is suboptimal and her symptoms of vomiting and diarrhea have stopped. - hydrochlorothiazide (HYDRODIURIL) 25 MG tablet; Take 1 tablet (25 mg total) by mouth daily.  Dispense: 90 tablet; Refill: 3  5. abnormal urinalysis at the urgent care September 2014. The history of present illness for details. It showed signs of infection as well as hematuria. Recheck urinalysis now. Send for culture. If culture shows no infection, will need to refer to urology for followup hematuria. If culture does show infection, then we will need to treat the infection and then do a repeat urinalysis to make sure the hematuria resolves.  Signed, [address] [place], [place], [facility] 11/19/2012 10:34 AM

## 2012-11-20 ENCOUNTER — Other Ambulatory Visit: Payer: Self-pay | Admitting: Family Medicine

## 2012-11-20 ENCOUNTER — Telehealth: Payer: Self-pay | Admitting: Family Medicine

## 2012-11-20 LAB — URINE CULTURE
Colony Count: NO GROWTH
Organism ID, Bacteria: NO GROWTH

## 2012-11-20 LAB — VITAMIN D 25 HYDROXY (VIT D DEFICIENCY, FRACTURES): Vit D, 25-Hydroxy: 25 ng/mL — ABNORMAL LOW (ref 30–89)

## 2012-11-20 NOTE — Telephone Encounter (Signed)
Pt wanted to [facility] pharmacy [facility] [place].

## 2012-12-03 ENCOUNTER — Telehealth: Payer: Self-pay | Admitting: Family Medicine

## 2012-12-03 DIAGNOSIS — R319 Hematuria, unspecified: Secondary | ICD-10-CM

## 2012-12-03 NOTE — Telephone Encounter (Signed)
Message copied by Donne Anon on Wed Dec 03, 2012 12:49 PM ------      Message from: Allayne Butcher      Created: Fri Nov 21, 2012  5:44 AM       Patient had been seen 10/20/12 with multiple problems.Marland Kitchen as far as lab abnormalities at that time:      1. Anemia: Tell her this is improved. Hemoglobin has gone from 8.9-10.1.  Good news!      #2 she had a UTI and a urinalysis that showed blood at that time.-- Repeat urinalysis now shows very small amount of blood. Culture confirms that there is no infection now.                                SHe needs to do one more followup urinalysis in about 2-3 weeks. If there is still blood present at that time we will discuss urology consult.      #3 some liver tests were elevated in September. Tell her these are now normal.            As well patient was here for screening labs. Tell her cholesterol looks good continue diet and exercise for this. Thyroid is normal. Vitamin D is slightly low start over-the-counter vitamin D 1000 units daily.      Place order for urinalysis. ------

## 2012-12-03 NOTE — Telephone Encounter (Signed)
Pt called back about lab results.  Lab results given along with provider recommendations.  Repeat urine ordered

## 2012-12-08 ENCOUNTER — Telehealth: Payer: Self-pay | Admitting: Family Medicine

## 2012-12-08 DIAGNOSIS — R1013 Epigastric pain: Secondary | ICD-10-CM

## 2012-12-08 MED ORDER — OMEPRAZOLE 20 MG PO CPDR: 20.0000 mg | DELAYED_RELEASE_CAPSULE | Freq: Every day | ORAL | Status: DC

## 2012-12-08 NOTE — Telephone Encounter (Signed)
Omeprazole 90 day supply 

## 2012-12-10 ENCOUNTER — Other Ambulatory Visit: Payer: Self-pay | Admitting: Family Medicine

## 2012-12-10 DIAGNOSIS — R1013 Epigastric pain: Secondary | ICD-10-CM

## 2012-12-10 MED ORDER — OMEPRAZOLE 20 MG PO CPDR: 20.0000 mg | DELAYED_RELEASE_CAPSULE | Freq: Every day | ORAL | Status: DC

## 2012-12-11 ENCOUNTER — Telehealth: Payer: Self-pay | Admitting: Family Medicine

## 2012-12-11 ENCOUNTER — Other Ambulatory Visit: Payer: BC Managed Care – PPO

## 2012-12-11 DIAGNOSIS — R319 Hematuria, unspecified: Secondary | ICD-10-CM

## 2012-12-11 LAB — URINALYSIS, ROUTINE W REFLEX MICROSCOPIC
Bilirubin Urine: NEGATIVE
Glucose, UA: NEGATIVE mg/dL
Ketones, ur: NEGATIVE mg/dL
Nitrite: NEGATIVE
Protein, ur: NEGATIVE mg/dL
Specific Gravity, Urine: 1.025 (ref 1.005–1.030)
Urobilinogen, UA: 0.2 mg/dL (ref 0.0–1.0)
pH: 6 (ref 5.0–8.0)

## 2012-12-11 LAB — URINALYSIS, MICROSCOPIC ONLY
Casts: NONE SEEN
Crystals: NONE SEEN

## 2012-12-11 NOTE — Telephone Encounter (Signed)
Message copied by Donne Anon on Thu Dec 11, 2012  3:59 PM ------      Message from: Allayne Butcher      Created: Thu Dec 11, 2012 11:27 AM       Tell Patient that UA continues to show some blood.      I recommend that she followup with a urologist.      Patient has a lot of her care done up in [place].      Find out where she wants to go to see a urologist. Then please make that referral. ------

## 2012-12-11 NOTE — Telephone Encounter (Signed)
Pt called with urine report.  Still has some blood in urine.  Provider wants her to see a Insurance underwriter.  She has asked to see someone in [place].  Referral initiated

## 2013-01-07 ENCOUNTER — Encounter: Payer: Self-pay | Admitting: Family Medicine

## 2013-01-07 DIAGNOSIS — M51379 Other intervertebral disc degeneration, lumbosacral region without mention of lumbar back pain or lower extremity pain: Secondary | ICD-10-CM | POA: Insufficient documentation

## 2013-01-07 DIAGNOSIS — M5137 Other intervertebral disc degeneration, lumbosacral region: Secondary | ICD-10-CM | POA: Insufficient documentation

## 2013-01-07 DIAGNOSIS — R3129 Other microscopic hematuria: Secondary | ICD-10-CM | POA: Insufficient documentation

## 2013-01-07 DIAGNOSIS — N200 Calculus of kidney: Secondary | ICD-10-CM | POA: Insufficient documentation

## 2013-01-07 DIAGNOSIS — N281 Cyst of kidney, acquired: Secondary | ICD-10-CM | POA: Insufficient documentation

## 2013-01-07 DIAGNOSIS — N8111 Cystocele, midline: Secondary | ICD-10-CM | POA: Insufficient documentation

## 2013-02-19 ENCOUNTER — Ambulatory Visit: Payer: BC Managed Care – PPO | Admitting: Physician Assistant

## 2013-02-26 ENCOUNTER — Other Ambulatory Visit: Payer: Self-pay | Admitting: Family Medicine

## 2013-03-27 ENCOUNTER — Telehealth: Payer: Self-pay | Admitting: Family Medicine

## 2013-03-27 DIAGNOSIS — K219 Gastro-esophageal reflux disease without esophagitis: Secondary | ICD-10-CM

## 2013-03-27 MED ORDER — FAMOTIDINE 20 MG PO TABS: 20.0000 mg | ORAL_TABLET | Freq: Two times a day (BID) | ORAL | Status: DC

## 2013-03-27 NOTE — Telephone Encounter (Signed)
Received [facility] from [facility] in reference to patient's use of Omeprazole.  They feel she does not require long term use of a PPI and can be safely changed to a H2 receptor antagonist.  Provider has reviewed their evaluation and is changing the patient's prescription to Pepcid 20 mg QD. I have left a message for the patient to call me back to make her aware.

## 2013-03-27 NOTE — Telephone Encounter (Signed)
Pt aware of medication change and has made routine f/u appt with provider

## 2013-04-01 ENCOUNTER — Ambulatory Visit: Payer: Self-pay | Admitting: Physician Assistant

## 2013-04-02 LAB — HM MAMMOGRAPHY: HM Mammogram: NORMAL

## 2013-04-03 ENCOUNTER — Encounter: Payer: Self-pay | Admitting: Family Medicine

## 2013-04-08 ENCOUNTER — Ambulatory Visit: Payer: BC Managed Care – PPO | Admitting: Physician Assistant

## 2013-04-15 ENCOUNTER — Ambulatory Visit (INDEPENDENT_AMBULATORY_CARE_PROVIDER_SITE_OTHER): Payer: BC Managed Care – PPO | Admitting: Physician Assistant

## 2013-04-15 ENCOUNTER — Encounter: Payer: Self-pay | Admitting: Physician Assistant

## 2013-04-15 VITALS — BP 154/92 | HR 72 | Temp 97.1°F | Resp 18 | Ht 62.25 in | Wt 175.0 lb

## 2013-04-15 DIAGNOSIS — E559 Vitamin D deficiency, unspecified: Secondary | ICD-10-CM

## 2013-04-15 DIAGNOSIS — I10 Essential (primary) hypertension: Secondary | ICD-10-CM

## 2013-04-15 DIAGNOSIS — R7989 Other specified abnormal findings of blood chemistry: Secondary | ICD-10-CM

## 2013-04-15 DIAGNOSIS — R3129 Other microscopic hematuria: Secondary | ICD-10-CM

## 2013-04-15 DIAGNOSIS — D649 Anemia, unspecified: Secondary | ICD-10-CM

## 2013-04-15 DIAGNOSIS — R945 Abnormal results of liver function studies: Secondary | ICD-10-CM

## 2013-04-15 LAB — COMPLETE METABOLIC PANEL WITH GFR
ALT: 29 U/L (ref 0–35)
AST: 22 U/L (ref 0–37)
Albumin: 4.7 g/dL (ref 3.5–5.2)
Alkaline Phosphatase: 77 U/L (ref 39–117)
BUN: 20 mg/dL (ref 6–23)
CO2: 25 mEq/L (ref 19–32)
Calcium: 9.7 mg/dL (ref 8.4–10.5)
Chloride: 103 mEq/L (ref 96–112)
Creat: 1.09 mg/dL (ref 0.50–1.10)
GFR, Est African American: 62 mL/min
GFR, Est Non African American: 54 mL/min — ABNORMAL LOW
Glucose, Bld: 85 mg/dL (ref 70–99)
Potassium: 4.4 mEq/L (ref 3.5–5.3)
Sodium: 141 mEq/L (ref 135–145)
Total Bilirubin: 0.6 mg/dL (ref 0.2–1.2)
Total Protein: 7.4 g/dL (ref 6.0–8.3)

## 2013-04-15 LAB — CBC WITH DIFFERENTIAL/PLATELET
Basophils Absolute: 0 10*3/uL (ref 0.0–0.1)
Basophils Relative: 0 % (ref 0–1)
Eosinophils Absolute: 0.4 10*3/uL (ref 0.0–0.7)
Eosinophils Relative: 5 % (ref 0–5)
HCT: 33.1 % — ABNORMAL LOW (ref 36.0–46.0)
Hemoglobin: 10.7 g/dL — ABNORMAL LOW (ref 12.0–15.0)
Lymphocytes Relative: 35 % (ref 12–46)
Lymphs Abs: 2.5 10*3/uL (ref 0.7–4.0)
MCH: 29.2 pg (ref 26.0–34.0)
MCHC: 32.3 g/dL (ref 30.0–36.0)
MCV: 90.2 fL (ref 78.0–100.0)
Monocytes Absolute: 0.4 10*3/uL (ref 0.1–1.0)
Monocytes Relative: 5 % (ref 3–12)
Neutro Abs: 3.9 10*3/uL (ref 1.7–7.7)
Neutrophils Relative %: 55 % (ref 43–77)
Platelets: 288 10*3/uL (ref 150–400)
RBC: 3.67 MIL/uL — ABNORMAL LOW (ref 3.87–5.11)
RDW: 13.2 % (ref 11.5–15.5)
WBC: 7 10*3/uL (ref 4.0–10.5)

## 2013-04-15 MED ORDER — AMLODIPINE BESYLATE 10 MG PO TABS: 10.0000 mg | ORAL_TABLET | Freq: Every day | ORAL | Status: DC

## 2013-04-15 NOTE — Progress Notes (Signed)
Patient ID: Gina Levine MRN: [id number], DOB: 06-04-1950, 63 y.o. Date of Encounter: @DATE @  Chief Complaint:  Chief Complaint  Patient presents with  . routine checkup    not fasting    HPI: 63 y.o. year old AA female  presents for routine followup office visit.  In SEPTEMBER 2014 she was seen by me regarding the following:  She had gone to an urgent care in [place] on  Saturday 10/18/12. She reported vomiting, diarrhea, dark urine. She also had abdominal pain, sudden onset, for 3 days. Described it as a burning pain in her upper abdomen. She also reported to them she had a history of anemia -was supposed to be taking iron pills but she was not taking these. Reported no hematochezia or melena. Labs at the urgent care on 10/18/12: CBC showed WBC 12.7, hemoglobin 9.7, hematocrit 30.6. MCV normal at 92. MCH normal at 29.1. CMET was normal. Urine was cloudy with moderate blood, positive nitrite, trace leukocyte. Hemoccult was negative. She is treated at the urgent care with 1 g of IM Rocephin. Was given prescription for Cipro for 5 days to start the following day.  She saw me on Monday 10/20/12 for followup. Reported continued nausea but no further vomiting since the urgent care. However was continuing to have diarrhea. Also reported weakness lightheadedness and presyncope. At the visit with me Hemoccult was again negative. CBC showed hemoglobin down to 8.9 and hematocrit 27.7. Given the hemoglobin had dropped further compared to 2 days prior referred her to GI.  As well CMET was performed and showed some elevated LFTs.  She had followup with [facility].  Did both EGD and a colonoscopy on 10/23/12. Colonoscopy showed one polyp. A polypectomy was performed and sent to pathology. Remainder of colonoscopy normal. Polyp was benign and he said she could repeat colonoscopy 10 years.   Endoscopy showed mild acute gastritis. Pathology was benign. Negative for H. pylori,  dysplasia, malignancy. Otherwise endoscopy was negative and normal.  Patient stated that GI told her to stop taking ibuprofen but did not order any new medications. Told her that if the anemia persisted she would need to follow up with hematology referral.  Patient reported that she did not address the elevated LFTs at all. However, on followup testing LFTs were back to normal.  At Followup with me, followup urinalysis continued to show positive hematuria with negative cultures. Therefore at her last visit with me which was 11/19/12 I made referral to urology. She says that she did follow up at [facility] urology.  SHE WAS SEEN BY ME FOR CPE 11/19/2012  Labs on 11/19/12 showed hemoglobin up to 10.1--up from 8.9 previously. LFTs remained normal. Vitamin D was low and she was instructed to start over-the-counter vitamin D 1,000 units daily. Other screening labs were normal.  Today she reports that she did have followup with urology and air evaluation was all negative. She has no complaints today.   Past Medical History  Diagnosis Date  . Hypertension   . Depression   . Nephrolithiasis   . Anemia      Home Meds: See attached medication section for current medication list. Any medications entered into computer today will not appear on this note's list. The medications listed below were entered prior to today. Current Outpatient Prescriptions on File Prior to Visit  Medication Sig Dispense Refill  . cholecalciferol (VITAMIN D) 1000 UNITS tablet Take 1,000 Units by mouth daily.      . hydrochlorothiazide (  HYDRODIURIL) 25 MG tablet Take 1 tablet (25 mg total) by mouth daily.  90 tablet  3   No current facility-administered medications on file prior to visit.    Allergies:  Allergies  Allergen Reactions  . Benazepril     History   Social History  . Marital Status: Married    Spouse Name: N/A    Number of Children: 4  . Years of Education: N/A   Occupational History  . Retired      Social History Main Topics  . Smoking status: Never Smoker   . Smokeless tobacco: Never Used  . Alcohol Use: No  . Drug Use: No  . Sexual Activity: Not on file   Other Topics Concern  . Not on file   Social History Narrative  . No narrative on file    Family History  Problem Relation Age of Onset  . Cerebrovascular Accident       Review of Systems:  See HPI for pertinent ROS. All other ROS negative.    Physical Exam: Blood pressure 154/92, pulse 72, temperature 97.1 F (36.2 C), temperature source Oral, resp. rate 18, height 5' 2.25" (1.581 m), weight 175 lb (79.379 kg)., Body mass index is 31.76 kg/(m^2). General: WNWD AAF. Appears in no acute distress. Neck: Supple. No thyromegaly. No lymphadenopathy. No carotid bruits. Lungs: Clear bilaterally to auscultation without wheezes, rales, or rhonchi. Breathing is unlabored. Heart: RRR with S1 S2. No murmurs, rubs, or gallops. Abdomen: Soft, non-tender, non-distended with normoactive bowel sounds. No hepatomegaly. No rebound/guarding. No obvious abdominal masses. Musculoskeletal:  Strength and tone normal for age. Extremities/Skin: Warm and dry. No clubbing or cyanosis. No edema. No rashes or suspicious lesions. Neuro: Alert and oriented X 3. Moves all extremities spontaneously. Gait is normal. CNII-XII grossly in tact. Psych:  Responds to questions appropriately with a normal affect.     ASSESSMENT AND PLAN:  63 y.o. year old female with  1. Hypertension Blood pressure was slightly high at her last appointment here on 11/19/12. It was 144/96 at that time. Blood pressure high again today. She says that she has not been checking it at home or elsewhere. We'll increase her Norvasc up to 10 mg a day. Have her schedule a followup office visit in 2 weeks to recheck blood pressure. - COMPLETE METABOLIC PANEL WITH GFR - amLODipine (NORVASC) 10 MG tablet; Take 1 tablet (10 mg total) by mouth daily.  Dispense: 90 tablet; Refill:  3  2. Anemia Recheck CBC to make sure this has remained stable. - CBC with Differential  3. Microscopic hematuria She reports she did have followup with [facility] and at that workup was negative.  4. Elevated LFTs These had returned to normal at last check. We'll check one more time today to make sure stools normal. - COMPLETE METABOLIC PANEL WITH GFR  5. Vitamin D deficiency Vitamin D was slightly low at lab on 11/19/12 with a level of 25.  Since then, she is taking over-the-counter vitamin D at 1000 units daily as directed. - Vit D  25 hydroxy (rtn osteoporosis monitoring)  FOR PREVENTIVE CARE, SEE CPE NOTE 11/19/12.   followup office visit 2 weeks followup hypertension.   Marin Olp [place], [place], [facility] 04/15/2013 2:13 PM

## 2013-04-16 ENCOUNTER — Telehealth: Payer: Self-pay | Admitting: Family Medicine

## 2013-04-16 LAB — VITAMIN D 25 HYDROXY (VIT D DEFICIENCY, FRACTURES): Vit D, 25-Hydroxy: 22 ng/mL — ABNORMAL LOW (ref 30–89)

## 2013-04-16 NOTE — Telephone Encounter (Signed)
Pt aware of lab results and provider recommendations 

## 2013-04-16 NOTE — Telephone Encounter (Signed)
Message copied by Olena Mater on Thu Apr 16, 2013 12:10 PM ------      Message from: Dena Billet      Created: Thu Apr 16, 2013  7:30 AM       Tell patient that her vitamin D level is still quite low. Currently taking 1000 units daily. Tell her to increase this up to 4000 units daily. Tell her that she can buy it in different doses and that she can actually get 4,000 unit pill.      Anemia is stable.      CMET is normal. ------

## 2013-04-29 ENCOUNTER — Ambulatory Visit (INDEPENDENT_AMBULATORY_CARE_PROVIDER_SITE_OTHER): Payer: BC Managed Care – PPO | Admitting: Physician Assistant

## 2013-04-29 ENCOUNTER — Encounter: Payer: Self-pay | Admitting: Physician Assistant

## 2013-04-29 VITALS — BP 124/82 | HR 72 | Temp 97.4°F | Resp 18 | Wt 176.0 lb

## 2013-04-29 DIAGNOSIS — I10 Essential (primary) hypertension: Secondary | ICD-10-CM

## 2013-04-29 DIAGNOSIS — E559 Vitamin D deficiency, unspecified: Secondary | ICD-10-CM

## 2013-04-29 NOTE — Progress Notes (Signed)
Patient ID: Gina Levine MRN: [id number], DOB: 23-Dec-1950, 63 y.o. Date of Encounter: 04/29/2013, 9:03 AM    Chief Complaint:  Chief Complaint  Patient presents with  . 2 week followup    HTN     HPI: 63 y.o. year old AA female here for followup of hypertension.  She gave me a hug and kiss and said thank you for helping me so much !!!!  At last office visit 04/15/13 her blood pressure was elevated. Reviewed that has been borderline high at the prior visit as well.  Therefore at that last visit I had her increase her Norvasc from 5 mg up to 10 mg. She is here today to followup and recheck. She says that she is taking the 10 mg dose. She is having no lower extremity edema and no other adverse effects. Actually says that she "is even feeling even better than she did before" !  Also reviewed labs done at the last visit. They were all normal except for low vitamin D. Told her to increase vitamin D from 1000 4000 units daily. She says that she did get that message and has increased the dose as instructed.     Home Meds: See attached medication section for any medications that were entered at today's visit. The computer does not put those onto this list.The following list is a list of meds entered prior to today's visit.   Current Outpatient Prescriptions on File Prior to Visit  Medication Sig Dispense Refill  . amLODipine (NORVASC) 10 MG tablet Take 1 tablet (10 mg total) by mouth daily.  90 tablet  3  . cholecalciferol (VITAMIN D) 1000 UNITS tablet Take 1,000 Units by mouth daily.      . famotidine (PEPCID) 20 MG tablet Take 20 mg by mouth daily.      . hydrochlorothiazide (HYDRODIURIL) 25 MG tablet Take 1 tablet (25 mg total) by mouth daily.  90 tablet  3  . omeprazole (PRILOSEC) 20 MG capsule Take 1 capsule by mouth daily.       No current facility-administered medications on file prior to visit.    Allergies:  Allergies  Allergen Reactions  . Benazepril       Review  of Systems: See HPI for pertinent ROS. All other ROS negative.    Physical Exam: Blood pressure 124/82, pulse 72, temperature 97.4 F (36.3 C), temperature source Oral, resp. rate 18, weight 176 lb (79.833 kg)., Body mass index is 31.94 kg/(m^2). General: WNWD AAF.  Appears in no acute distress.  Neck: Supple. No thyromegaly. No lymphadenopathy. No carotid bruits. Lungs: Clear bilaterally to auscultation without wheezes, rales, or rhonchi. Breathing is unlabored. Heart: Regular rhythm. No murmurs, rubs, or gallops. Msk:  Strength and tone normal for age. Extremities/Skin: Warm and dry.  No edema.  Neuro: Alert and oriented X 3. Moves all extremities spontaneously. Gait is normal. CNII-XII grossly in tact. Psych:  Responds to questions appropriately with a normal affect.     ASSESSMENT AND PLAN:  63 y.o. year old female with  1. Hypertension Blood pressure is at goal. Continue current medications. She just had lab at recent visit so do not need to repeat now.  2. Vitamin D deficiency She did increase her dose to 4000 units daily as recommended at the recent lab report.  For Other recent medical problems that I have been managing see her last note dated 04/15/13.  For last CPE see note dated 11/19/12  Today I discussed that  now she will be due to return for regular visit in 6 months.  Discussed that she can wait and schedule this on or after 11/20/13 and schedule it as a complete physical exam. She is agreeable to do so.   Signed, [address] [place], [place], [facility] 04/29/2013 9:03 AM

## 2013-09-22 ENCOUNTER — Ambulatory Visit (INDEPENDENT_AMBULATORY_CARE_PROVIDER_SITE_OTHER): Payer: BC Managed Care – PPO | Admitting: Family Medicine

## 2013-09-22 ENCOUNTER — Encounter: Payer: Self-pay | Admitting: Family Medicine

## 2013-09-22 VITALS — BP 110/60 | HR 68 | Temp 97.8°F | Resp 18 | Wt 166.0 lb

## 2013-09-22 DIAGNOSIS — Z4802 Encounter for removal of sutures: Secondary | ICD-10-CM

## 2013-09-22 NOTE — Progress Notes (Signed)
   Subjective:    Patient ID: Gina Levine, female    DOB: 1950/12/14, 63 y.o.   MRN: [id number]  HPI Patient suffered a U-shaped laceration to the radial surface of her left index finger 8 days ago. She went to an urgent care where the wound was closed with sutures. She is here today for suture removal. There is no evidence of cellulitis or infection. The wound appears to be healing well. There is no evidence of dehiscence. Past Medical History  Diagnosis Date  . Hypertension   . Depression   . Nephrolithiasis   . Anemia    Current Outpatient Prescriptions on File Prior to Visit  Medication Sig Dispense Refill  . amLODipine (NORVASC) 10 MG tablet Take 1 tablet (10 mg total) by mouth daily.  90 tablet  3  . cholecalciferol (VITAMIN D) 1000 UNITS tablet Take 1,000 Units by mouth daily.      . famotidine (PEPCID) 20 MG tablet Take 20 mg by mouth daily.      . hydrochlorothiazide (HYDRODIURIL) 25 MG tablet Take 1 tablet (25 mg total) by mouth daily.  90 tablet  3  . omeprazole (PRILOSEC) 20 MG capsule Take 1 capsule by mouth daily.       No current facility-administered medications on file prior to visit.   Past Surgical History  Procedure Laterality Date  . Lithotripsy      x 2   Allergies  Allergen Reactions  . Benazepril    History   Social History  . Marital Status: Married    Spouse Name: N/A    Number of Children: 4  . Years of Education: N/A   Occupational History  . Retired    Social History Main Topics  . Smoking status: Never Smoker   . Smokeless tobacco: Never Used  . Alcohol Use: No  . Drug Use: No  . Sexual Activity: Not on file   Other Topics Concern  . Not on file   Social History Narrative  . No narrative on file      Review of Systems  All other systems reviewed and are negative.      Objective:   Physical Exam  Vitals reviewed. Cardiovascular: Normal rate and regular rhythm.   Pulmonary/Chest: Effort normal and breath sounds  normal.   well healed U-shaped laceration on the radial surface of the left index finger        Assessment & Plan:  1. Visit for suture removal Sutures removed without complication. The wound was reinforced with Steri-Strips. Wound care was discussed. Followup in one week if no better, immediately if worse.

## 2013-11-24 ENCOUNTER — Telehealth: Payer: Self-pay | Admitting: Family Medicine

## 2013-11-24 MED ORDER — FAMOTIDINE 20 MG PO TABS
20.0000 mg | ORAL_TABLET | Freq: Two times a day (BID) | ORAL | Status: DC
Start: 2013-11-24 — End: 2014-01-29

## 2013-11-24 NOTE — Telephone Encounter (Signed)
Medication refilled per protocol. 

## 2013-12-01 ENCOUNTER — Ambulatory Visit (INDEPENDENT_AMBULATORY_CARE_PROVIDER_SITE_OTHER): Payer: BC Managed Care – PPO | Admitting: Family Medicine

## 2013-12-01 ENCOUNTER — Encounter: Payer: Self-pay | Admitting: Family Medicine

## 2013-12-01 VITALS — BP 138/84 | HR 78 | Temp 98.0°F | Resp 18 | Ht 62.0 in | Wt 161.0 lb

## 2013-12-01 DIAGNOSIS — Z23 Encounter for immunization: Secondary | ICD-10-CM

## 2013-12-01 DIAGNOSIS — Z0000 Encounter for general adult medical examination without abnormal findings: Secondary | ICD-10-CM

## 2013-12-01 LAB — COMPLETE METABOLIC PANEL WITH GFR
ALT: 10 U/L (ref 0–35)
AST: 14 U/L (ref 0–37)
Albumin: 4.6 g/dL (ref 3.5–5.2)
Alkaline Phosphatase: 66 U/L (ref 39–117)
BUN: 21 mg/dL (ref 6–23)
CO2: 28 mEq/L (ref 19–32)
Calcium: 10.1 mg/dL (ref 8.4–10.5)
Chloride: 104 mEq/L (ref 96–112)
Creat: 1.15 mg/dL — ABNORMAL HIGH (ref 0.50–1.10)
GFR, Est African American: 59 mL/min — ABNORMAL LOW
GFR, Est Non African American: 51 mL/min — ABNORMAL LOW
Glucose, Bld: 83 mg/dL (ref 70–99)
Potassium: 4.3 mEq/L (ref 3.5–5.3)
Sodium: 139 mEq/L (ref 135–145)
Total Bilirubin: 0.7 mg/dL (ref 0.2–1.2)
Total Protein: 7.3 g/dL (ref 6.0–8.3)

## 2013-12-01 LAB — LIPID PANEL
Cholesterol: 198 mg/dL (ref 0–200)
HDL: 57 mg/dL (ref >39)
LDL Cholesterol: 123 mg/dL — ABNORMAL HIGH (ref 0–99)
Total CHOL/HDL Ratio: 3.5 Ratio
Triglycerides: 91 mg/dL (ref <150)
VLDL: 18 mg/dL (ref 0–40)

## 2013-12-01 LAB — CBC WITH DIFFERENTIAL/PLATELET
Basophils Absolute: 0.1 10*3/uL (ref 0.0–0.1)
Basophils Relative: 1 % (ref 0–1)
Eosinophils Absolute: 0.3 10*3/uL (ref 0.0–0.7)
Eosinophils Relative: 4 % (ref 0–5)
HCT: 35.1 % — ABNORMAL LOW (ref 36.0–46.0)
Hemoglobin: 11.5 g/dL — ABNORMAL LOW (ref 12.0–15.0)
Lymphocytes Relative: 33 % (ref 12–46)
Lymphs Abs: 2.6 10*3/uL (ref 0.7–4.0)
MCH: 29.9 pg (ref 26.0–34.0)
MCHC: 32.8 g/dL (ref 30.0–36.0)
MCV: 91.4 fL (ref 78.0–100.0)
Monocytes Absolute: 0.4 10*3/uL (ref 0.1–1.0)
Monocytes Relative: 5 % (ref 3–12)
Neutro Abs: 4.5 10*3/uL (ref 1.7–7.7)
Neutrophils Relative %: 57 % (ref 43–77)
Platelets: 305 10*3/uL (ref 150–400)
RBC: 3.84 MIL/uL — ABNORMAL LOW (ref 3.87–5.11)
RDW: 13.1 % (ref 11.5–15.5)
WBC: 7.9 10*3/uL (ref 4.0–10.5)

## 2013-12-01 NOTE — Progress Notes (Signed)
Subjective:    Patient ID: Gina Levine, female    DOB: 1950/09/08, 63 y.o.   MRN: [id number]  HPI Patient is a very pleasant 63 year old [place] American female who presents today for complete physical exam and Pap smear. Her last colonoscopy was in 2014 and was normal. Her last mammogram was 05/20/2013. Her last Pap smear was in 2012. She is due today for repeat Pap smear. She is also due for a flu vaccine. Otherwise her preventative care is up-to-date. She is also due to recheck her cholesterol, her vitamin D level which was low in March, and her hemoglobin which was also blood. Patient has been taking an iron supplement. She is currently on both Pepcid and omeprazole for acid reflux. She is asymptomatic and questioning if she can stop one. I recommended that she discontinue the Pepcid and see if her symptoms remain silent. Past Medical History  Diagnosis Date  . Hypertension   . Depression   . Nephrolithiasis   . Anemia    Past Surgical History  Procedure Laterality Date  . Lithotripsy      x 2   Current Outpatient Prescriptions on File Prior to Visit  Medication Sig Dispense Refill  . amLODipine (NORVASC) 10 MG tablet Take 1 tablet (10 mg total) by mouth daily.  90 tablet  3  . cholecalciferol (VITAMIN D) 1000 UNITS tablet Take 1,000 Units by mouth daily.      . famotidine (PEPCID) 20 MG tablet Take 1 tablet (20 mg total) by mouth 2 (two) times daily.  60 tablet  0  . hydrochlorothiazide (HYDRODIURIL) 25 MG tablet Take 1 tablet (25 mg total) by mouth daily.  90 tablet  3  . omeprazole (PRILOSEC) 20 MG capsule Take 1 capsule by mouth daily.       No current facility-administered medications on file prior to visit.   Allergies  Allergen Reactions  . Benazepril    History   Social History  . Marital Status: Married    Spouse Name: N/A    Number of Children: 4  . Years of Education: N/A   Occupational History  . Retired    Social History Main Topics  . Smoking  status: Never Smoker   . Smokeless tobacco: Never Used  . Alcohol Use: No  . Drug Use: No  . Sexual Activity: Not on file   Other Topics Concern  . Not on file   Social History Narrative  . No narrative on file   Family History  Problem Relation Age of Onset  . Cerebrovascular Accident        Review of Systems  All other systems reviewed and are negative.      Objective:   Physical Exam  Vitals reviewed. Constitutional: She is oriented to person, place, and time. She appears well-developed and well-nourished. No distress.  HENT:  Head: Normocephalic and atraumatic.  Right Ear: External ear normal.  Left Ear: External ear normal.  Nose: Nose normal.  Mouth/Throat: Oropharynx is clear and moist. No oropharyngeal exudate.  Eyes: Conjunctivae and EOM are normal. Pupils are equal, round, and reactive to light. Right eye exhibits no discharge. Left eye exhibits no discharge. No scleral icterus.  Neck: Normal range of motion. Neck supple. No JVD present. No tracheal deviation present. No thyromegaly present.  Cardiovascular: Normal rate, regular rhythm, normal heart sounds and intact distal pulses.  Exam reveals no gallop and no friction rub.   No murmur heard. Pulmonary/Chest: Effort normal and breath sounds  normal. No stridor. No respiratory distress. She has no wheezes. She has no rales. She exhibits no tenderness.  Abdominal: Soft. Bowel sounds are normal. She exhibits no distension and no mass. There is no tenderness. There is no rebound and no guarding.  Genitourinary: Vagina normal and uterus normal.  Musculoskeletal: Normal range of motion. She exhibits no edema and no tenderness.  Lymphadenopathy:    She has no cervical adenopathy.  Neurological: She is alert and oriented to person, place, and time. She has normal reflexes. She displays normal reflexes. No cranial nerve deficit. She exhibits normal muscle tone. Coordination normal.  Skin: Skin is warm. No rash noted.  She is not diaphoretic. No erythema. No pallor.  Psychiatric: She has a normal mood and affect. Her behavior is normal. Judgment and thought content normal.          Assessment & Plan:  Routine general medical examination at a health care facility - Plan: CBC with Differential, COMPLETE METABOLIC PANEL WITH GFR, Lipid panel, Vit D  25 hydroxy (rtn osteoporosis monitoring), PAP, Thin Prep w/HPV rflx HPV Type 16/18 [address])  Patient's physical exam today is completely normal. Patient will schedule her mammogram next February. Her colonoscopy is up to date. Her Pap smear was performed today. I'll also check a CBC, CMP, fasting lipid panel, and vitamin D level. Her blood pressure is excellent. I recommended that the patient discontinue Pepcid. Diminished continued iron 325 mg by mouth daily. Also recommended that she add vitamin D 2000 units by mouth daily.

## 2013-12-01 NOTE — Addendum Note (Signed)
Addended by: Shary Decamp B on: 12/01/2013 09:44 AM   Modules accepted: Orders

## 2013-12-02 LAB — VITAMIN D 25 HYDROXY (VIT D DEFICIENCY, FRACTURES): Vit D, 25-Hydroxy: 42 ng/mL (ref 30–89)

## 2013-12-03 LAB — PAP, THIN PREP W/HPV RFLX HPV TYPE 16/18: HPV DNA High Risk: NOT DETECTED

## 2013-12-04 ENCOUNTER — Other Ambulatory Visit: Payer: Self-pay | Admitting: Physician Assistant

## 2013-12-07 NOTE — Telephone Encounter (Signed)
Refill appropriate and filled per protocol. 

## 2013-12-26 ENCOUNTER — Other Ambulatory Visit: Payer: Self-pay | Admitting: Physician Assistant

## 2013-12-28 NOTE — Telephone Encounter (Signed)
Medication refilled per protocol. 

## 2014-01-29 ENCOUNTER — Other Ambulatory Visit: Payer: Self-pay | Admitting: Physician Assistant

## 2014-01-29 NOTE — Telephone Encounter (Signed)
Medication refilled per protocol. 

## 2014-04-05 LAB — HM MAMMOGRAPHY: HM Mammogram: NORMAL

## 2014-04-06 ENCOUNTER — Encounter: Payer: Self-pay | Admitting: Family Medicine

## 2014-08-27 ENCOUNTER — Encounter: Payer: Self-pay | Admitting: Family Medicine

## 2014-08-27 ENCOUNTER — Ambulatory Visit (INDEPENDENT_AMBULATORY_CARE_PROVIDER_SITE_OTHER): Payer: BLUE CROSS/BLUE SHIELD | Admitting: Family Medicine

## 2014-08-27 VITALS — BP 126/64 | HR 68 | Temp 98.2°F | Resp 12 | Ht 62.0 in | Wt 154.0 lb

## 2014-08-27 DIAGNOSIS — R197 Diarrhea, unspecified: Secondary | ICD-10-CM

## 2014-08-27 LAB — COMPLETE METABOLIC PANEL WITH GFR
ALT: 21 U/L (ref 0–35)
AST: 23 U/L (ref 0–37)
Albumin: 3.9 g/dL (ref 3.5–5.2)
Alkaline Phosphatase: 46 U/L (ref 39–117)
BUN: 11 mg/dL (ref 6–23)
CO2: 24 mEq/L (ref 19–32)
Calcium: 9.1 mg/dL (ref 8.4–10.5)
Chloride: 108 mEq/L (ref 96–112)
Creat: 1.32 mg/dL — ABNORMAL HIGH (ref 0.50–1.10)
GFR, Est African American: 49 mL/min — ABNORMAL LOW
GFR, Est Non African American: 43 mL/min — ABNORMAL LOW
Glucose, Bld: 88 mg/dL (ref 70–99)
Potassium: 3.5 mEq/L (ref 3.5–5.3)
Sodium: 142 mEq/L (ref 135–145)
Total Bilirubin: 0.5 mg/dL (ref 0.2–1.2)
Total Protein: 6.4 g/dL (ref 6.0–8.3)

## 2014-08-27 LAB — CBC WITH DIFFERENTIAL/PLATELET
Basophils Absolute: 0 10*3/uL (ref 0.0–0.1)
Basophils Relative: 0 % (ref 0–1)
Eosinophils Absolute: 0.2 10*3/uL (ref 0.0–0.7)
Eosinophils Relative: 3 % (ref 0–5)
HCT: 34 % — ABNORMAL LOW (ref 36.0–46.0)
Hemoglobin: 10.7 g/dL — ABNORMAL LOW (ref 12.0–15.0)
Lymphocytes Relative: 27 % (ref 12–46)
Lymphs Abs: 1.9 10*3/uL (ref 0.7–4.0)
MCH: 29.6 pg (ref 26.0–34.0)
MCHC: 31.5 g/dL (ref 30.0–36.0)
MCV: 94.2 fL (ref 78.0–100.0)
MPV: 11 fL (ref 8.6–12.4)
Monocytes Absolute: 0.4 10*3/uL (ref 0.1–1.0)
Monocytes Relative: 6 % (ref 3–12)
Neutro Abs: 4.5 10*3/uL (ref 1.7–7.7)
Neutrophils Relative %: 64 % (ref 43–77)
Platelets: 302 10*3/uL (ref 150–400)
RBC: 3.61 MIL/uL — ABNORMAL LOW (ref 3.87–5.11)
RDW: 13 % (ref 11.5–15.5)
WBC: 7.1 10*3/uL (ref 4.0–10.5)

## 2014-08-27 LAB — TSH: TSH: 1.264 u[IU]/mL (ref 0.350–4.500)

## 2014-08-27 MED ORDER — DIPHENOXYLATE-ATROPINE 2.5-0.025 MG PO TABS: 2.0000 | ORAL_TABLET | Freq: Four times a day (QID) | ORAL | Status: DC | PRN

## 2014-08-27 NOTE — Progress Notes (Signed)
Subjective:    Patient ID: Gina Levine, female    DOB: [birth year], 64 y.o.   MRN: [id number]  HPI A she has had 2 weeks of watery diarrhea. She states that she is going to the bathroom 6 times a day. It is mainly water but there is some black lumpy tarry stool mixed in with it.  She also reports severe fatigue. She denies any abdominal pain. She denies any nausea or vomiting. She denies any fevers or chills. She had a colonoscopy in 2014 revealed benign polyps. She is on omeprazole and famotidine for acid reflux but these have never caused diarrhea in the past. I performed a rectal exam today there was no stool in the rectal vault. There was trace fecal material on my finger. When I performed a guaiac testing was inconclusive due to the lack of stool in the rectal exam. Past Medical History  Diagnosis Date  . Hypertension   . Depression   . Nephrolithiasis   . Anemia    Past Surgical History  Procedure Laterality Date  . Lithotripsy      x 2   Current Outpatient Prescriptions on File Prior to Visit  Medication Sig Dispense Refill  . amLODipine (NORVASC) 10 MG tablet Take 1 tablet (10 mg total) by mouth daily. 90 tablet 3  . cholecalciferol (VITAMIN D) 1000 UNITS tablet Take 1,000 Units by mouth daily.    . famotidine (PEPCID) 20 MG tablet TAKE 1 TABLET BY MOUTH TWICE A DAY 60 tablet 5  . hydrochlorothiazide (HYDRODIURIL) 25 MG tablet TAKE 1 TABLET BY MOUTH EVERY DAY 90 tablet 3  . omeprazole (PRILOSEC) 20 MG capsule TAKE ONE CAPSULE BY MOUTH DAILY 90 capsule 3   No current facility-administered medications on file prior to visit.   Allergies  Allergen Reactions  . Benazepril    History   Social History  . Marital Status: Married    Spouse Name: N/A  . Number of Children: 4  . Years of Education: N/A   Occupational History  . Retired    Social History Main Topics  . Smoking status: Never Smoker   . Smokeless tobacco: Never Used  . Alcohol Use: No  . Drug Use:  No  . Sexual Activity: Not on file   Other Topics Concern  . Not on file   Social History Narrative     Review of Systems  All other systems reviewed and are negative.      Objective:   Physical Exam  Constitutional: She appears well-developed and well-nourished.  Cardiovascular: Normal rate, regular rhythm and normal heart sounds.  Exam reveals no gallop and no friction rub.   No murmur heard. Pulmonary/Chest: Effort normal and breath sounds normal. No respiratory distress. She has no wheezes. She has no rales.  Abdominal: Soft. Bowel sounds are normal. She exhibits no distension and no mass. There is no tenderness. There is no rebound and no guarding.  Musculoskeletal: She exhibits no edema.  Lymphadenopathy:    She has no cervical adenopathy.  Vitals reviewed.         Assessment & Plan:  Diarrhea - Plan: CBC with Differential/Platelet, COMPLETE METABOLIC PANEL WITH GFR, TSH, Sedimentation rate, Gastrointestinal Pathogen Panel PCR, diphenoxylate-atropine (LOMOTIL) 2.5-0.025 MG per tablet, Fecal occult blood, imunochemical, Fecal occult blood, imunochemical, Fecal occult blood, imunochemical  I checked a fingerstick hemoglobin today that was 10.2. Patient typically runs 10.7-11. There is no evidence of acute upper GI bleed that could be causing the black  tarry diarrhea. I will check a CBC, CMP, TSH, sedimentation rate, a GI pathogen panel as well as fecal occult blood cards 3 to work this up further. Meanwhile I will treat the patient symptomatically with Lomotil 2 tablets every 6 hours as needed for diarrhea and recheck next week to discuss lab work and stool studies

## 2014-08-28 LAB — SEDIMENTATION RATE: Sed Rate: 1 mm/hr (ref 0–30)

## 2014-09-01 ENCOUNTER — Other Ambulatory Visit: Payer: BLUE CROSS/BLUE SHIELD

## 2014-09-02 LAB — FECAL OCCULT BLOOD, IMMUNOCHEMICAL
Fecal Occult Blood: NEGATIVE
Fecal Occult Blood: NEGATIVE
Fecal Occult Blood: NEGATIVE

## 2014-09-03 ENCOUNTER — Encounter: Payer: Self-pay | Admitting: Family Medicine

## 2014-09-03 ENCOUNTER — Ambulatory Visit (INDEPENDENT_AMBULATORY_CARE_PROVIDER_SITE_OTHER): Payer: BLUE CROSS/BLUE SHIELD | Admitting: Family Medicine

## 2014-09-03 VITALS — BP 124/68 | HR 66 | Temp 98.1°F | Resp 16 | Ht 62.0 in | Wt 153.0 lb

## 2014-09-03 DIAGNOSIS — F411 Generalized anxiety disorder: Secondary | ICD-10-CM

## 2014-09-03 MED ORDER — ALPRAZOLAM 0.5 MG PO TABS: 0.5000 mg | ORAL_TABLET | Freq: Three times a day (TID) | ORAL | Status: DC | PRN

## 2014-09-03 NOTE — Progress Notes (Signed)
Subjective:    Patient ID: Gina Levine, female    DOB: 13-Jun-1950, 64 y.o.   MRN: [id number] 08/27/14 A she has had 2 weeks of watery diarrhea. She states that she is going to the bathroom 6 times a day. It is mainly water but there is some black lumpy tarry stool mixed in with it.  She also reports severe fatigue. She denies any abdominal pain. She denies any nausea or vomiting. She denies any fevers or chills. She had a colonoscopy in 2014 revealed benign polyps. She is on omeprazole and famotidine for acid reflux but these have never caused diarrhea in the past. I performed a rectal exam today there was no stool in the rectal vault. There was trace fecal material on my finger. When I performed a guaiac testing was inconclusive due to the lack of stool in the rectal exam.  At that time, my plan was: I checked a fingerstick hemoglobin today that was 10.2. Patient typically runs 10.7-11. There is no evidence of acute upper GI bleed that could be causing the black tarry diarrhea. I will check a CBC, CMP, TSH, sedimentation rate, a GI pathogen panel as well as fecal occult blood cards 3 to work this up further. Meanwhile I will treat the patient symptomatically with Lomotil 2 tablets every 6 hours as needed for diarrhea and recheck next week to discuss lab work and stool studies  09/03/14 Patient's lab work came back completely normal.  All three of the stool cards were negative for blood.  Labs were completely normal.  Patient is tearful.  She is under a lot of stress.  She is very anxious sue to stress at home.  She thinks stress caring for her sister with dmeentia and her grandkids are causing the diarrhea.   Past Medical History  Diagnosis Date  . Hypertension   . Depression   . Nephrolithiasis   . Anemia    Past Surgical History  Procedure Laterality Date  . Lithotripsy      x 2   Current Outpatient Prescriptions on File Prior to Visit  Medication Sig Dispense Refill  . amLODipine  (NORVASC) 10 MG tablet Take 1 tablet (10 mg total) by mouth daily. 90 tablet 3  . cholecalciferol (VITAMIN D) 1000 UNITS tablet Take 1,000 Units by mouth daily.    . diphenoxylate-atropine (LOMOTIL) 2.5-0.025 MG per tablet Take 2 tablets by mouth 4 (four) times daily as needed for diarrhea or loose stools. 30 tablet 0  . famotidine (PEPCID) 20 MG tablet TAKE 1 TABLET BY MOUTH TWICE A DAY 60 tablet 5  . hydrochlorothiazide (HYDRODIURIL) 25 MG tablet TAKE 1 TABLET BY MOUTH EVERY DAY 90 tablet 3  . omeprazole (PRILOSEC) 20 MG capsule TAKE ONE CAPSULE BY MOUTH DAILY 90 capsule 3   No current facility-administered medications on file prior to visit.   Allergies  Allergen Reactions  . Benazepril    History   Social History  . Marital Status: Married    Spouse Name: N/A  . Number of Children: 4  . Years of Education: N/A   Occupational History  . Retired    Social History Main Topics  . Smoking status: Never Smoker   . Smokeless tobacco: Never Used  . Alcohol Use: No  . Drug Use: No  . Sexual Activity: Not on file   Other Topics Concern  . Not on file   Social History Narrative     Review of Systems  All other systems  reviewed and are negative.      Objective:   Physical Exam  Constitutional: She appears well-developed and well-nourished.  Cardiovascular: Normal rate, regular rhythm and normal heart sounds.  Exam reveals no gallop and no friction rub.   No murmur heard. Pulmonary/Chest: Effort normal and breath sounds normal. No respiratory distress. She has no wheezes. She has no rales.  Abdominal: Soft. Bowel sounds are normal. She exhibits no distension and no mass. There is no tenderness. There is no rebound and no guarding.  Musculoskeletal: She exhibits no edema.  Lymphadenopathy:    She has no cervical adenopathy.  Vitals reviewed.         Assessment & Plan:  Anxiety state - Plan: ALPRAZolam (XANAX) 0.5 MG tablet  Try xanax 0.5 mg poq 8hrs prn anxiety.   Recheck in 2 weeks if no better as her grandkids are returning back to their home then.  Consider GI referral if persistant.

## 2014-09-08 LAB — OTHER SOLSTAS TEST: Miscellaneous Test: 65008

## 2014-09-08 LAB — GASTROINTESTINAL PATHOGEN PANEL PCR

## 2014-09-10 ENCOUNTER — Encounter (HOSPITAL_COMMUNITY): Payer: Self-pay | Admitting: Emergency Medicine

## 2014-09-10 DIAGNOSIS — Z862 Personal history of diseases of the blood and blood-forming organs and certain disorders involving the immune mechanism: Secondary | ICD-10-CM | POA: Insufficient documentation

## 2014-09-10 DIAGNOSIS — M79601 Pain in right arm: Secondary | ICD-10-CM | POA: Insufficient documentation

## 2014-09-10 DIAGNOSIS — Z87442 Personal history of urinary calculi: Secondary | ICD-10-CM | POA: Insufficient documentation

## 2014-09-10 DIAGNOSIS — I10 Essential (primary) hypertension: Secondary | ICD-10-CM | POA: Insufficient documentation

## 2014-09-10 DIAGNOSIS — R634 Abnormal weight loss: Secondary | ICD-10-CM | POA: Insufficient documentation

## 2014-09-10 DIAGNOSIS — R197 Diarrhea, unspecified: Secondary | ICD-10-CM | POA: Insufficient documentation

## 2014-09-10 DIAGNOSIS — E860 Dehydration: Secondary | ICD-10-CM | POA: Diagnosis not present

## 2014-09-10 DIAGNOSIS — F329 Major depressive disorder, single episode, unspecified: Secondary | ICD-10-CM | POA: Insufficient documentation

## 2014-09-10 DIAGNOSIS — Z79899 Other long term (current) drug therapy: Secondary | ICD-10-CM | POA: Diagnosis not present

## 2014-09-10 DIAGNOSIS — N179 Acute kidney failure, unspecified: Secondary | ICD-10-CM | POA: Diagnosis not present

## 2014-09-10 NOTE — ED Notes (Signed)
Pt. reports intermittent diarrhea and emesis for 4 weeks , also reported right forearm pain onset today  Denies injury / no swelling or deformity.

## 2014-09-11 ENCOUNTER — Emergency Department (HOSPITAL_COMMUNITY): Payer: BLUE CROSS/BLUE SHIELD

## 2014-09-11 ENCOUNTER — Emergency Department (HOSPITAL_COMMUNITY)
Admission: EM | Admit: 2014-09-11 | Discharge: 2014-09-11 | Disposition: A | Discharge status: Home / Self Care | Payer: BLUE CROSS/BLUE SHIELD | Attending: Emergency Medicine | Admitting: Emergency Medicine

## 2014-09-11 DIAGNOSIS — R1110 Vomiting, unspecified: Secondary | ICD-10-CM

## 2014-09-11 DIAGNOSIS — N179 Acute kidney failure, unspecified: Secondary | ICD-10-CM

## 2014-09-11 DIAGNOSIS — R197 Diarrhea, unspecified: Secondary | ICD-10-CM

## 2014-09-11 DIAGNOSIS — R634 Abnormal weight loss: Secondary | ICD-10-CM

## 2014-09-11 DIAGNOSIS — M79601 Pain in right arm: Secondary | ICD-10-CM

## 2014-09-11 DIAGNOSIS — E860 Dehydration: Secondary | ICD-10-CM

## 2014-09-11 LAB — COMPREHENSIVE METABOLIC PANEL
ALT: 16 U/L (ref 14–54)
AST: 19 U/L (ref 15–41)
Albumin: 3.3 g/dL — ABNORMAL LOW (ref 3.5–5.0)
Alkaline Phosphatase: 54 U/L (ref 38–126)
Anion gap: 10 (ref 5–15)
BUN: 15 mg/dL (ref 6–20)
CO2: 22 mmol/L (ref 22–32)
Calcium: 8.7 mg/dL — ABNORMAL LOW (ref 8.9–10.3)
Chloride: 104 mmol/L (ref 101–111)
Creatinine, Ser: 1.53 mg/dL — ABNORMAL HIGH (ref 0.44–1.00)
GFR calc Af Amer: 40 mL/min — ABNORMAL LOW (ref >60)
GFR calc non Af Amer: 35 mL/min — ABNORMAL LOW (ref >60)
Glucose, Bld: 102 mg/dL — ABNORMAL HIGH (ref 65–99)
Potassium: 3.2 mmol/L — ABNORMAL LOW (ref 3.5–5.1)
Sodium: 136 mmol/L (ref 135–145)
Total Bilirubin: 0.7 mg/dL (ref 0.3–1.2)
Total Protein: 6.2 g/dL — ABNORMAL LOW (ref 6.5–8.1)

## 2014-09-11 LAB — CBC WITH DIFFERENTIAL/PLATELET
Basophils Absolute: 0 10*3/uL (ref 0.0–0.1)
Basophils Relative: 0 % (ref 0–1)
Eosinophils Absolute: 0.1 10*3/uL (ref 0.0–0.7)
Eosinophils Relative: 1 % (ref 0–5)
HCT: 30.1 % — ABNORMAL LOW (ref 36.0–46.0)
Hemoglobin: 10.1 g/dL — ABNORMAL LOW (ref 12.0–15.0)
Lymphocytes Relative: 17 % (ref 12–46)
Lymphs Abs: 1.8 10*3/uL (ref 0.7–4.0)
MCH: 29.6 pg (ref 26.0–34.0)
MCHC: 33.6 g/dL (ref 30.0–36.0)
MCV: 88.3 fL (ref 78.0–100.0)
Monocytes Absolute: 0.7 10*3/uL (ref 0.1–1.0)
Monocytes Relative: 7 % (ref 3–12)
Neutro Abs: 7.6 10*3/uL (ref 1.7–7.7)
Neutrophils Relative %: 75 % (ref 43–77)
Platelets: 326 10*3/uL (ref 150–400)
RBC: 3.41 MIL/uL — ABNORMAL LOW (ref 3.87–5.11)
RDW: 12.5 % (ref 11.5–15.5)
WBC: 10.2 10*3/uL (ref 4.0–10.5)

## 2014-09-11 LAB — LIPASE, BLOOD: Lipase: 19 U/L — ABNORMAL LOW (ref 22–51)

## 2014-09-11 MED ORDER — IOHEXOL 300 MG/ML  SOLN
25.0000 mL | INTRAMUSCULAR | Status: AC
  Administered 2014-09-11 (×2): 25 mL via ORAL

## 2014-09-11 MED ORDER — SODIUM CHLORIDE 0.9 % IV BOLUS (SEPSIS)
1000.0000 mL | Freq: Once | INTRAVENOUS | Status: AC
  Administered 2014-09-11: 1000 mL via INTRAVENOUS

## 2014-09-11 MED ORDER — LOPERAMIDE HCL 2 MG PO CAPS: 2.0000 mg | ORAL_CAPSULE | Freq: Four times a day (QID) | ORAL | Status: DC | PRN

## 2014-09-11 MED ORDER — HYDROCODONE-ACETAMINOPHEN 5-325 MG PO TABS
1.0000 | ORAL_TABLET | Freq: Once | ORAL | Status: AC
  Administered 2014-09-11: 1 via ORAL
  Filled 2014-09-11: qty 1

## 2014-09-11 MED ORDER — ONDANSETRON 4 MG PO TBDP: 4.0000 mg | ORAL_TABLET | Freq: Three times a day (TID) | ORAL | Status: DC | PRN

## 2014-09-11 NOTE — ED Notes (Signed)
Pt given ginger ale to drink. 

## 2014-09-11 NOTE — ED Notes (Signed)
Pt verbalized understanding of d/c instructions and has no further questions. Pt to follow up with GI and Dr Dennard Schaumann.

## 2014-09-11 NOTE — ED Provider Notes (Signed)
CSN: [id number]     Arrival date & time 09/10/14  2334 History  This chart was scribed for  Gina Hacker, MD by Altamease Oiler, ED Scribe. This patient was seen in room A02C/A02C and the patient's care was started at 12:56 AM.    Chief Complaint  Patient presents with  . Emesis  . Diarrhea  . Arm Pain    The history is provided by the patient. No language interpreter was used.   Gina Levine is a 64 y.o. female who presents to the Emergency Department complaining of atraumatic right forearm pain with onset today. She rates the constant  pain 8/10 in severity and describes it as sore. She has been lifting and holding a 65 year old with the right arm but denies any unusual activity. The pain is exacerbated by movement. Took nothing PTA.  No tingling or difficulty gripping in the extremity, fever, chest pain, or SOB. Pt is right-hand dominant. Also complains of nausea, vomiting, diarrhea and weight loss for 1 month. Pt denies abdominal pain. No known sick contacts. She has been seen by her PCP for the n/v/d where she had normal lab work but no abdominal imaging. No personal history of cancer.   Past Medical History  Diagnosis Date  . Hypertension   . Depression   . Nephrolithiasis   . Anemia    Past Surgical History  Procedure Laterality Date  . Lithotripsy      x 2   Family History  Problem Relation Age of Onset  . Cerebrovascular Accident     History  Substance Use Topics  . Smoking status: Never Smoker   . Smokeless tobacco: Never Used  . Alcohol Use: No   OB History    No data available     Review of Systems  Constitutional: Positive for unexpected weight change. Negative for fever.  Respiratory: Negative for cough, chest tightness and shortness of breath.   Cardiovascular: Negative for chest pain.  Gastrointestinal: Positive for nausea, vomiting and abdominal pain.  Genitourinary: Negative for dysuria.  Musculoskeletal:       Right arm pain  Neurological:  Negative for headaches.  Psychiatric/Behavioral: Negative for confusion.  All other systems reviewed and are negative.     Allergies  Benazepril  Home Medications   Prior to Admission medications   Medication Sig Start Date End Date Taking? Authorizing Provider  amLODipine (NORVASC) 10 MG tablet Take 1 tablet (10 mg total) by mouth daily. 04/15/13  Yes Orlena Sheldon, PA-C  cholecalciferol (VITAMIN D) 1000 UNITS tablet Take 1,000 Units by mouth daily.   Yes Historical Provider, MD  diphenoxylate-atropine (LOMOTIL) 2.5-0.025 MG per tablet Take 2 tablets by mouth 4 (four) times daily as needed for diarrhea or loose stools. 08/27/14  Yes Susy Frizzle, MD  famotidine (PEPCID) 20 MG tablet TAKE 1 TABLET BY MOUTH TWICE A DAY 01/29/14  Yes Susy Frizzle, MD  hydrochlorothiazide (HYDRODIURIL) 25 MG tablet TAKE 1 TABLET BY MOUTH EVERY DAY 12/07/13  Yes Susy Frizzle, MD  omeprazole (PRILOSEC) 20 MG capsule TAKE ONE CAPSULE BY MOUTH DAILY 12/28/13  Yes Susy Frizzle, MD  ALPRAZolam Duanne Moron) 0.5 MG tablet Take 1 tablet (0.5 mg total) by mouth 3 (three) times daily as needed for anxiety. Patient not taking: Reported on 09/11/2014 09/03/14   Susy Frizzle, MD  loperamide (IMODIUM) 2 MG capsule Take 1 capsule (2 mg total) by mouth 4 (four) times daily as needed for diarrhea or loose  stools. 09/11/14   Gina Hacker, MD  ondansetron (ZOFRAN ODT) 4 MG disintegrating tablet Take 1 tablet (4 mg total) by mouth every 8 (eight) hours as needed for nausea or vomiting. 09/11/14   Gina Hacker, MD   BP 120/64 mmHg  Pulse 71  Temp(Src) 97.6 F (36.4 C) (Oral)  Resp 16  Wt 149 lb 8 oz (67.813 kg)  SpO2 99% Physical Exam  Constitutional: She is oriented to person, place, and time. She appears well-developed and well-nourished. No distress.  HENT:  Head: Normocephalic and atraumatic.  Eyes: Pupils are equal, round, and reactive to light.  Cardiovascular: Normal rate, regular rhythm and  normal heart sounds.   No murmur heard. Pulmonary/Chest: Effort normal and breath sounds normal. No respiratory distress. She has no wheezes.  Abdominal: Soft. Bowel sounds are normal. There is no tenderness. There is no rebound.  Musculoskeletal: Normal range of motion. She exhibits no edema.  Normal range of motion of the right shoulder, elbow, and wrist, no obvious deformities, no swelling, no tenderness to palpation, good grip strength, 2+ radial pulse  Neurological: She is alert and oriented to person, place, and time.  Skin: Skin is warm and dry.  Psychiatric: She has a normal mood and affect.  Nursing note and vitals reviewed.   ED Course  Procedures   DIAGNOSTIC STUDIES: Oxygen Saturation is 99% on RA, normal by my interpretation.    COORDINATION OF CARE: 1:02 AM Discussed treatment plan which includes right forearm XR, lab work, EKG, Norco/Vicodin, and IVF with pt at bedside and pt agreed to plan.  Labs Review Labs Reviewed  CBC WITH DIFFERENTIAL/PLATELET - Abnormal; Notable for the following:    RBC 3.41 (*)    Hemoglobin 10.1 (*)    HCT 30.1 (*)    All other components within normal limits  COMPREHENSIVE METABOLIC PANEL - Abnormal; Notable for the following:    Potassium 3.2 (*)    Glucose, Bld 102 (*)    Creatinine, Ser 1.53 (*)    Calcium 8.7 (*)    Total Protein 6.2 (*)    Albumin 3.3 (*)    GFR calc non Af Amer 35 (*)    GFR calc Af Amer 40 (*)    All other components within normal limits  LIPASE, BLOOD - Abnormal; Notable for the following:    Lipase 19 (*)    All other components within normal limits    Imaging Review Ct Abdomen Pelvis Wo Contrast  09/11/2014   CLINICAL DATA:  Nausea, vomiting and diarrhea.  Weight loss.  EXAM: CT ABDOMEN AND PELVIS WITHOUT CONTRAST  TECHNIQUE: Multidetector CT imaging of the abdomen and pelvis was performed following the standard protocol without IV contrast.  COMPARISON:  CT 07/13/2008  FINDINGS: Lower chest: Minimal  atelectasis at the right lung base. Physiologic pericardial fluid.  Liver: Unremarkable unenhanced appearance.  Hepatobiliary: Gallbladder physiologically distended. No biliary dilatation.  Pancreas: No ductal dilatation or surrounding inflammatory change.  Spleen: Normal.  Adrenal glands: No nodule.  Kidneys: Bilateral nonobstructing renal calculi. Multiple cysts on prior exam are less well seen currently given lack of intravenous contrast. Extrarenal pelvis configuration of the right kidney. No hydronephrosis or perinephric stranding.  Stomach/Bowel: Stomach is decompressed. There are no dilated or thickened small bowel loops. Small volume of stool throughout the colon without colonic wall thickening. Sigmoid colon is tortuous. The appendix is normal.  Vascular/Lymphatic: No retroperitoneal adenopathy. Abdominal aorta is normal in caliber.  Reproductive: Enlarged heterogeneous fibroid uterus,  however decreased in size from prior exam. No adnexal mass.  Bladder: Physiologically distended.  Other: No free air, free fluid, or intra-abdominal fluid collection.  Musculoskeletal: There are no acute or suspicious osseous abnormalities. Degenerative disc disease in the lumbar spine.  IMPRESSION: 1. No acute abnormality in the abdomen/pelvis. 2. Bilateral nonobstructing renal calculi. The bilateral renal cysts on prior exam are not as well defined currently due to lack of contrast. 3. Fibroid uterus, however decreased in size from prior exam.   Electronically Signed   By: Jeb Levering M.D.   On: 09/11/2014 04:14   Dg Forearm Right  09/11/2014   CLINICAL DATA:  Right forearm pain, onset today. Initial encounter. No trauma.  EXAM: RIGHT FOREARM - 2 VIEW  COMPARISON:  None.  FINDINGS: There is no evidence of fracture or other focal bone lesions. Soft tissues are unremarkable.  IMPRESSION: Negative.   Electronically Signed   By: Andreas Newport M.D.   On: 09/11/2014 01:54     EKG Interpretation   Date/Time:   Saturday September 11 2014 01:15:04 EDT Ventricular Rate:  73 PR Interval:  162 QRS Duration: 81 QT Interval:  399 QTC Calculation: 440 R Axis:   67 Text Interpretation:  Sinus rhythm Confirmed by HORTON  MD, COURTNEY  ([id number]) on 09/11/2014 1:32:51 AM      MDM   Final diagnoses:  Vomiting and diarrhea  Weight loss  Pain of right upper extremity  Dehydration  AKI (acute kidney injury)    Patient presents with multiple complaints. Reports chronic vomiting, diarrhea, weight loss. Also noted to have right upper extremity pain prior to arrival. Nontoxic on exam. Abdominal exam is benign. She has been worked up by her primary physician. I have reviewed this workup. Included stool cultures and basic labwork.  Lab work here notable for creatinine of 1.5 which is been intermittently increasing. This likely reflects mild dehydration. Patient given fluids. Plain films of the arm are negative. Patient does report lifting her granddaughter frequently with the right arm. This could be the cause of her pain. CT scan of the abdomen and pelvis obtained given weight loss and persistent GI symptoms. This is reassuring. Discussed results with the patient. Abdominal exam continues to be benign. Patient was able to tolerate fluids. Patient will be referred to GI as an outpatient. She also needs a repeat BMP by her primary physician given increasing creatinine.  After history, exam, and medical workup I feel the patient has been appropriately medically screened and is safe for discharge home. Pertinent diagnoses were discussed with the patient. Patient was given return precautions.  I personally performed the services described in this documentation, which was scribed in my presence. The recorded information has been reviewed and is accurate.      Gina Hacker, MD 09/12/14 [phone]

## 2014-09-11 NOTE — Discharge Instructions (Signed)
You were seen today for a one-month history of vomiting and diarrhea as well as right arm pain. Your workup is reassuring. You do appear mildly dehydrated.  You need to follow-up with her primary doctor for repeat kidney function testing. You will be given the phone number for gastroenterology.  Dehydration, Adult Dehydration is when you lose more fluids from the body than you take in. Vital organs like the kidneys, brain, and heart cannot function without a proper amount of fluids and salt. Any loss of fluids from the body can cause dehydration.  CAUSES   Vomiting.  Diarrhea.  Excessive sweating.  Excessive urine output.  Fever. SYMPTOMS  Mild dehydration  Thirst.  Dry lips.  Slightly dry mouth. Moderate dehydration  Very dry mouth.  Sunken eyes.  Skin does not bounce back quickly when lightly pinched and released.  Dark urine and decreased urine production.  Decreased tear production.  Headache. Severe dehydration  Very dry mouth.  Extreme thirst.  Rapid, weak pulse (more than 100 beats per minute at rest).  Cold hands and feet.  Not able to sweat in spite of heat and temperature.  Rapid breathing.  Blue lips.  Confusion and lethargy.  Difficulty being awakened.  Minimal urine production.  No tears. DIAGNOSIS  Your caregiver will diagnose dehydration based on your symptoms and your exam. Blood and urine tests will help confirm the diagnosis. The diagnostic evaluation should also identify the cause of dehydration. TREATMENT  Treatment of mild or moderate dehydration can often be done at home by increasing the amount of fluids that you drink. It is best to drink small amounts of fluid more often. Drinking too much at one time can make vomiting worse. Refer to the home care instructions below. Severe dehydration needs to be treated at the hospital where you will probably be given intravenous (IV) fluids that contain water and electrolytes. HOME CARE  INSTRUCTIONS   Ask your caregiver about specific rehydration instructions.  Drink enough fluids to keep your urine clear or pale yellow.  Drink small amounts frequently if you have nausea and vomiting.  Eat as you normally do.  Avoid:  Foods or drinks high in sugar.  Carbonated drinks.  Juice.  Extremely hot or cold fluids.  Drinks with caffeine.  Fatty, greasy foods.  Alcohol.  Tobacco.  Overeating.  Gelatin desserts.  Wash your hands well to avoid spreading bacteria and viruses.  Only take over-the-counter or prescription medicines for pain, discomfort, or fever as directed by your caregiver.  Ask your caregiver if you should continue all prescribed and over-the-counter medicines.  Keep all follow-up appointments with your caregiver. SEEK MEDICAL CARE IF:  You have abdominal pain and it increases or stays in one area (localizes).  You have a rash, stiff neck, or severe headache.  You are irritable, sleepy, or difficult to awaken.  You are weak, dizzy, or extremely thirsty. SEEK IMMEDIATE MEDICAL CARE IF:   You are unable to keep fluids down or you get worse despite treatment.  You have frequent episodes of vomiting or diarrhea.  You have blood or green matter (bile) in your vomit.  You have blood in your stool or your stool looks black and tarry.  You have not urinated in 6 to 8 hours, or you have only urinated a small amount of very dark urine.  You have a fever.  You faint. MAKE SURE YOU:   Understand these instructions.  Will watch your condition.  Will get help right away if  you are not doing well or get worse. Document Released: 01/29/2005 Document Revised: 04/23/2011 Document Reviewed: 09/18/2010 [facility] Patient Information 2015 [place], [place]. This information is not intended to replace advice given to you by your health care provider. Make sure you discuss any questions you have with your health care provider.   Diarrhea Diarrhea  is frequent loose and watery bowel movements. It can cause you to feel weak and dehydrated. Dehydration can cause you to become tired and thirsty, have a dry mouth, and have decreased urination that often is dark yellow. Diarrhea is a sign of another problem, most often an infection that will not last long. In most cases, diarrhea typically lasts 2-3 days. However, it can last longer if it is a sign of something more serious. It is important to treat your diarrhea as directed by your caregiver to lessen or prevent future episodes of diarrhea. CAUSES  Some common causes include:  Gastrointestinal infections caused by viruses, bacteria, or parasites.  Food poisoning or food allergies.  Certain medicines, such as antibiotics, chemotherapy, and laxatives.  Artificial sweeteners and fructose.  Digestive disorders. HOME CARE INSTRUCTIONS  Ensure adequate fluid intake (hydration): Have 1 cup (8 oz) of fluid for each diarrhea episode. Avoid fluids that contain simple sugars or sports drinks, fruit juices, whole milk products, and sodas. Your urine should be clear or pale yellow if you are drinking enough fluids. Hydrate with an oral rehydration solution that you can purchase at pharmacies, retail stores, and online. You can prepare an oral rehydration solution at home by mixing the following ingredients together:   - tsp table salt.   tsp baking soda.   tsp salt substitute containing potassium chloride.  1  tablespoons sugar.  1 L (34 oz) of water.  Certain foods and beverages may increase the speed at which food moves through the gastrointestinal (GI) tract. These foods and beverages should be avoided and include:  Caffeinated and alcoholic beverages.  High-fiber foods, such as raw fruits and vegetables, nuts, seeds, and whole grain breads and cereals.  Foods and beverages sweetened with sugar alcohols, such as xylitol, sorbitol, and mannitol.  Some foods may be well tolerated and may  help thicken stool including:  Starchy foods, such as rice, toast, pasta, low-sugar cereal, oatmeal, grits, baked potatoes, crackers, and bagels.  Bananas.  Applesauce.  Add probiotic-rich foods to help increase healthy bacteria in the GI tract, such as yogurt and fermented milk products.  Wash your hands well after each diarrhea episode.  Only take over-the-counter or prescription medicines as directed by your caregiver.  Take a warm bath to relieve any burning or pain from frequent diarrhea episodes. SEEK IMMEDIATE MEDICAL CARE IF:   You are unable to keep fluids down.  You have persistent vomiting.  You have blood in your stool, or your stools are black and tarry.  You do not urinate in 6-8 hours, or there is only a small amount of very dark urine.  You have abdominal pain that increases or localizes.  You have weakness, dizziness, confusion, or light-headedness.  You have a severe headache.  Your diarrhea gets worse or does not get better.  You have a fever or persistent symptoms for more than 2-3 days.  You have a fever and your symptoms suddenly get worse. MAKE SURE YOU:   Understand these instructions.  Will watch your condition.  Will get help right away if you are not doing well or get worse. Document Released: 01/19/2002 Document Revised: 06/15/2013  Document Reviewed: 10/07/2011 [facility] Patient Information 2015 [place], [place]. This information is not intended to replace advice given to you by your health care provider. Make sure you discuss any questions you have with your health care provider.

## 2014-09-14 ENCOUNTER — Other Ambulatory Visit: Payer: Self-pay | Admitting: Physician Assistant

## 2014-09-14 ENCOUNTER — Encounter: Payer: Self-pay | Admitting: Family Medicine

## 2014-09-14 NOTE — Telephone Encounter (Signed)
Refill appropriate and filled per protocol. 

## 2014-09-19 ENCOUNTER — Emergency Department (HOSPITAL_COMMUNITY)
Admission: EM | Admit: 2014-09-19 | Discharge: 2014-09-19 | Disposition: A | Discharge status: Home / Self Care | Payer: BLUE CROSS/BLUE SHIELD | Attending: Emergency Medicine | Admitting: Emergency Medicine

## 2014-09-19 ENCOUNTER — Encounter (HOSPITAL_COMMUNITY): Payer: Self-pay | Admitting: Emergency Medicine

## 2014-09-19 ENCOUNTER — Emergency Department (HOSPITAL_COMMUNITY): Payer: BLUE CROSS/BLUE SHIELD

## 2014-09-19 DIAGNOSIS — Z862 Personal history of diseases of the blood and blood-forming organs and certain disorders involving the immune mechanism: Secondary | ICD-10-CM | POA: Diagnosis not present

## 2014-09-19 DIAGNOSIS — R197 Diarrhea, unspecified: Secondary | ICD-10-CM | POA: Diagnosis not present

## 2014-09-19 DIAGNOSIS — M545 Low back pain, unspecified: Secondary | ICD-10-CM

## 2014-09-19 DIAGNOSIS — R109 Unspecified abdominal pain: Secondary | ICD-10-CM | POA: Diagnosis present

## 2014-09-19 DIAGNOSIS — I10 Essential (primary) hypertension: Secondary | ICD-10-CM | POA: Diagnosis not present

## 2014-09-19 DIAGNOSIS — N23 Unspecified renal colic: Secondary | ICD-10-CM

## 2014-09-19 DIAGNOSIS — Z8659 Personal history of other mental and behavioral disorders: Secondary | ICD-10-CM | POA: Diagnosis not present

## 2014-09-19 LAB — URINE MICROSCOPIC-ADD ON

## 2014-09-19 LAB — URINALYSIS, ROUTINE W REFLEX MICROSCOPIC
Bilirubin Urine: NEGATIVE
Glucose, UA: NEGATIVE mg/dL
Ketones, ur: 15 mg/dL — AB
Nitrite: NEGATIVE
Protein, ur: 30 mg/dL — AB
Specific Gravity, Urine: 1.014 (ref 1.005–1.030)
Urobilinogen, UA: 0.2 mg/dL (ref 0.0–1.0)
pH: 6 (ref 5.0–8.0)

## 2014-09-19 LAB — COMPREHENSIVE METABOLIC PANEL
ALT: 19 U/L (ref 14–54)
AST: 25 U/L (ref 15–41)
Albumin: 3.4 g/dL — ABNORMAL LOW (ref 3.5–5.0)
Alkaline Phosphatase: 54 U/L (ref 38–126)
Anion gap: 11 (ref 5–15)
BUN: 14 mg/dL (ref 6–20)
CO2: 23 mmol/L (ref 22–32)
Calcium: 8.8 mg/dL — ABNORMAL LOW (ref 8.9–10.3)
Chloride: 103 mmol/L (ref 101–111)
Creatinine, Ser: 1.51 mg/dL — ABNORMAL HIGH (ref 0.44–1.00)
GFR calc Af Amer: 41 mL/min — ABNORMAL LOW (ref >60)
GFR calc non Af Amer: 35 mL/min — ABNORMAL LOW (ref >60)
Glucose, Bld: 125 mg/dL — ABNORMAL HIGH (ref 65–99)
Potassium: 3.2 mmol/L — ABNORMAL LOW (ref 3.5–5.1)
Sodium: 137 mmol/L (ref 135–145)
Total Bilirubin: 0.4 mg/dL (ref 0.3–1.2)
Total Protein: 6.3 g/dL — ABNORMAL LOW (ref 6.5–8.1)

## 2014-09-19 LAB — CBC
HCT: 30.9 % — ABNORMAL LOW (ref 36.0–46.0)
Hemoglobin: 10.3 g/dL — ABNORMAL LOW (ref 12.0–15.0)
MCH: 30 pg (ref 26.0–34.0)
MCHC: 33.3 g/dL (ref 30.0–36.0)
MCV: 90.1 fL (ref 78.0–100.0)
Platelets: 340 10*3/uL (ref 150–400)
RBC: 3.43 MIL/uL — ABNORMAL LOW (ref 3.87–5.11)
RDW: 12.8 % (ref 11.5–15.5)
WBC: 9.8 10*3/uL (ref 4.0–10.5)

## 2014-09-19 LAB — LIPASE, BLOOD: Lipase: 32 U/L (ref 22–51)

## 2014-09-19 MED ORDER — KETOROLAC TROMETHAMINE 30 MG/ML IJ SOLN
30.0000 mg | Freq: Once | INTRAMUSCULAR | Status: AC
  Administered 2014-09-19: 30 mg via INTRAVENOUS
  Filled 2014-09-19: qty 1

## 2014-09-19 MED ORDER — TAMSULOSIN HCL 0.4 MG PO CAPS: 0.4000 mg | ORAL_CAPSULE | Freq: Every day | ORAL | Status: DC

## 2014-09-19 MED ORDER — SODIUM CHLORIDE 0.9 % IV BOLUS (SEPSIS)
1000.0000 mL | Freq: Once | INTRAVENOUS | Status: AC
  Administered 2014-09-19: 1000 mL via INTRAVENOUS

## 2014-09-19 MED ORDER — HYDROCODONE-ACETAMINOPHEN 5-325 MG PO TABS: 2.0000 | ORAL_TABLET | Freq: Four times a day (QID) | ORAL | Status: DC | PRN

## 2014-09-19 MED ORDER — ONDANSETRON HCL 4 MG/2ML IJ SOLN
4.0000 mg | Freq: Once | INTRAMUSCULAR | Status: AC
  Administered 2014-09-19: 4 mg via INTRAVENOUS
  Filled 2014-09-19: qty 2

## 2014-09-19 NOTE — ED Notes (Signed)
Daughter stated, about a hour ago she stated having abdominal pain, (pt. Pointed to right flank area). She's had vomiting and diarrhea for about a month.  She did get bitten by a tick about a month ago.

## 2014-09-19 NOTE — ED Notes (Signed)
Asked pt to provide urine specimen pt stated she could not provide one at this time.

## 2014-09-19 NOTE — ED Notes (Signed)
Patient transported to Ultrasound 

## 2014-09-19 NOTE — ED Provider Notes (Signed)
History   Chief Complaint  Patient presents with  . Flank Pain  . Emesis    HPI 64 year old female with past medical history of hypertension, prior uncomplicated kidney stones who presents to ED complaining of 4 weeks of persistent nausea, vomiting, diarrhea as well as new right lower back pain which began today that she says is reminiscent of kidney stones she has had in the past. Additionally, patient reports having some mild dysuria which began today. Diarrhea has been watery and she denies any recent metabolic use. She denies any fevers, chills. Patient reports she has been having nonbilious nonbloody emesis after any attempts to eat over the past several weeks. Patient was seen recently in this ED on 7/30 where patient had screening labs and CT scan and subsequently discharged after unremarkable workup at that time. At that time, patient's CT did show bilateral nonobstructing proximal kidney stones. Patient reports she was set up with GI and has an appointment in the next couple weeks with them. Patient was given Zofran which she has been trying to take but subsequently vomited after taking it. Onset was gradual. No modifying factors. Severity is rated 8/10. No other associated symptoms.  Past medical/surgical history, social history, medications, allergies and FH have been reviewed with patient and/or in documentation. Furthermore, if pt family or friend(s) present, additional historical information was obtained from them.  Past Medical History  Diagnosis Date  . Hypertension   . Depression   . Nephrolithiasis   . Anemia    Past Surgical History  Procedure Laterality Date  . Lithotripsy      x 2   Family History  Problem Relation Age of Onset  . Cerebrovascular Accident     History  Substance Use Topics  . Smoking status: Never Smoker   . Smokeless tobacco: Never Used  . Alcohol Use: No     Review of Systems Constitutional: - F/C, +fatigue.  HENT: - congestion,  -rhinorrhea, -sore throat.   Eyes: - eye pain, -visual disturbance.  Respiratory: - cough, -SOB, -hemoptysis.   Cardiovascular: - CP, -palps.  Gastrointestinal: + N/V/D, -abd pain  Genitourinary: + flank pain, +dysuria, -frequency.  Musculoskeletal: - myalgia/arthritis, -joint swelling, -gait abnormality, +back pain, -neck pain/stiffness, -leg pain/swelling.  Skin: - rash/lesion.  Neurological: - focal weakness, -lightheadedness, -dizziness, -numbness, -HA.  All other systems reviewed and are negative.   Physical Exam  Physical Exam  ED Triage Vitals  Enc Vitals Group     BP 09/19/14 1446 133/74 mmHg     Pulse Rate 09/19/14 1446 77     Resp 09/19/14 1446 18     Temp 09/19/14 1446 98.2 F (36.8 C)     Temp Source 09/19/14 1446 Oral     SpO2 09/19/14 1446 96 %     Weight 09/19/14 1446 145 lb (65.772 kg)     Height 09/19/14 1446 5\' 2"  (1.575 m)     Head Cir --      Peak Flow --      Pain Score 09/19/14 1449 8     Pain Loc --      Pain Edu? --      Excl. in [place]? --    Constitutional: Chronically ill appearing 64 year old female who appears in no apparent distress. Patient is well hydrated. Head: Normocephalic and atraumatic.  Eyes: Extraocular motion intact, no scleral icterus Mouth: MMM, OP clear Neck: Supple without meningismus, mass, or overt JVD Respiratory: No respiratory distress. Normal WOB. No w/r/g. CV: RRR, no  obvious murmurs.  Pulses +2 and symmetric. Euvolemic Abdomen: Soft, NT, ND, no r/g. No mass. No CVA TTP. MSK: Extremities are atraumatic without deformity, ROM intact Skin: Warm, dry, intact without rash Neuro: AAOx4, MAE 5/5 sym, no focal deficit noted   ED Course  Procedures   Labs Reviewed  COMPREHENSIVE METABOLIC PANEL - Abnormal; Notable for the following:    Potassium 3.2 (*)    Glucose, Bld 125 (*)    Creatinine, Ser 1.51 (*)    Calcium 8.8 (*)    Total Protein 6.3 (*)    Albumin 3.4 (*)    GFR calc non Af Amer 35 (*)    GFR calc Af Amer 41  (*)    All other components within normal limits  CBC - Abnormal; Notable for the following:    RBC 3.43 (*)    Hemoglobin 10.3 (*)    HCT 30.9 (*)    All other components within normal limits  URINALYSIS, ROUTINE W REFLEX MICROSCOPIC (NOT AT [facility]) - Abnormal; Notable for the following:    Color, Urine AMBER (*)    APPearance CLOUDY (*)    Hgb urine dipstick LARGE (*)    Ketones, ur 15 (*)    Protein, ur 30 (*)    Leukocytes, UA TRACE (*)    All other components within normal limits  LIPASE, BLOOD  URINE MICROSCOPIC-ADD ON   I personally reviewed and interpreted all labs.  US Renal  09/19/2014   CLINICAL DATA:  Evaluate for hydronephrosis.  EXAM: RENAL / URINARY TRACT ULTRASOUND COMPLETE  COMPARISON:  CT 09/11/2014  FINDINGS: Right Kidney:  Length: 10.9 cm. No hydronephrosis. Multiple echogenic foci compatible with stones the largest of which measures 7 mm. Lower pole renal cyst measuring 1.2 cm.  Left Kidney:  Length: 10.5 cm. No hydronephrosis. Multiple echogenic foci compatible with stones, the largest of which measures 9 mm.  Bladder:  Appears normal for degree of bladder distention.  IMPRESSION: No hydronephrosis.  Bilateral nephrolithiasis.   Electronically Signed   By: Lovey Newcomer M.D.   On: 09/19/2014 18:04   I personally viewed above image(s) which were used in my medical decision making. Formal interpretations by Radiology.  MDM: Gina Levine is a 64 y.o. female with H&P as above who p/w CC: Persistent nausea, vomiting, diarrhea for 4 weeks as well as new right lower back pain and dysuria  -Initial impression:  given known bilateral nonobstructing kidney stones on prior CT abdomen and pelvis 7/30, I feel patient's back pain is likely associated with renal colic this time. Patient is well-appearing, well-hydrated and a benign abdominal exam. Patient will received screening labs, IV fluids, toradol.  Renal ultrasound ordered.  Workup is unremarkable today; Cr 1.5, pt  received 1 L NS in ED. Patient does have some blood in her urine but it does not appear to be infected. Given known kidney stones, nonobst stones on prior CT and today's [place], blood in urine, we will treat patient for renal colic. Patient is deemed stable for discharge and she will be discharged home with pain medication. Patient already has nausea medication at home. Patient will be given Flomax. Patient has follow up close with GI regarding her ongoing vomiting and diarrhea over the past month. Patient stable for discharge.  Old records reviewed (if available). Labs and imaging reviewed personally by myself and considered in medical decision making if ordered.  Clinical Impression: 1. Right-sided low back pain without sciatica   2. Renal colic  Disposition: Discharge  Condition: Good  I have discussed the results, Dx and Tx plan with the pt(& family if present). He/she/they expressed understanding and agree(s) with the plan. Discharge instructions discussed at great length. Strict return precautions discussed and pt &/or family have verbalized understanding of the instructions. No further questions at time of discharge.    New Prescriptions   HYDROCODONE-ACETAMINOPHEN (NORCO/VICODIN) 5-325 MG PER TABLET    Take 2 tablets by mouth every 6 (six) hours as needed.   TAMSULOSIN (FLOMAX) 0.4 MG CAPS CAPSULE    Take 1 capsule (0.4 mg total) by mouth daily after breakfast.    Follow Up: Susy Frizzle, MD [address] [facility] [address] [place] [address] [phone]  Schedule an appointment as soon as possible for a visit in 3 days   [place] [address] [phone] Jeddo Burleigh [phone]  If symptoms worsen   Pt seen in conjunction with Dr. Delora Fuel, MD  Kirstie Peri, [place] [facility] Resident - PGY-3      Kirstie Peri, MD 09/25/86 [id number]  Delora Fuel, MD 97/47/18 5501

## 2014-09-19 NOTE — ED Notes (Signed)
Pt stable, ambulatory, states understanding of discharge instructions 

## 2014-09-29 ENCOUNTER — Other Ambulatory Visit (INDEPENDENT_AMBULATORY_CARE_PROVIDER_SITE_OTHER): Payer: BLUE CROSS/BLUE SHIELD

## 2014-09-29 ENCOUNTER — Encounter: Payer: Self-pay | Admitting: Nurse Practitioner

## 2014-09-29 ENCOUNTER — Ambulatory Visit (INDEPENDENT_AMBULATORY_CARE_PROVIDER_SITE_OTHER): Payer: BLUE CROSS/BLUE SHIELD | Admitting: Nurse Practitioner

## 2014-09-29 VITALS — BP 130/62 | HR 80 | Ht 62.0 in | Wt 143.8 lb

## 2014-09-29 DIAGNOSIS — R197 Diarrhea, unspecified: Secondary | ICD-10-CM | POA: Diagnosis not present

## 2014-09-29 DIAGNOSIS — R112 Nausea with vomiting, unspecified: Secondary | ICD-10-CM

## 2014-09-29 DIAGNOSIS — R634 Abnormal weight loss: Secondary | ICD-10-CM

## 2014-09-29 LAB — BASIC METABOLIC PANEL
BUN: 10 mg/dL (ref 6–23)
CO2: 24 mEq/L (ref 19–32)
Calcium: 9.1 mg/dL (ref 8.4–10.5)
Chloride: 106 mEq/L (ref 96–112)
Creatinine, Ser: 1.18 mg/dL (ref 0.40–1.20)
GFR: 59.17 mL/min — ABNORMAL LOW (ref >60.00)
Glucose, Bld: 97 mg/dL (ref 70–99)
Potassium: 4.3 mEq/L (ref 3.5–5.1)
Sodium: 136 mEq/L (ref 135–145)

## 2014-09-29 NOTE — Addendum Note (Signed)
Addended by: Jerene Bears on: 09/29/2014 08:44 PM   Modules accepted: Level of Service

## 2014-09-29 NOTE — Patient Instructions (Signed)
Please go to the basement level to have your labs drawn.  Increase Lomitil to 3 times daily. Continue Zofran twice daily as needed for nausea. We have given you a sample of Align. Take 1 capsule daily. You can get this at any pharmacy, [facility], [facility], [employer], [employer].  [employer] provided.  Call us in 10 days with an update on your condition.  You can ask for Paula's nurse or Dianna Ewald, her Psychologist, sport and exercise.

## 2014-09-29 NOTE — Progress Notes (Addendum)
History of Present Illness:  Patient is a 64 year old female who I saw 2 years back for evaluation of anemia which was actually chronic. Patient was also noted to have abnormal liver function studies. She underwent upper endoscopy and colonoscopy. EGD revealed mild acute gastritis (biopsies normal). Colonoscopy was normal other than one 3 mm sessile ascending colon  " polyp" (biopsy negative for adenomatous tissue).  Her LFTs did normalize upon recheck.   Patient referred by PCP. She saw PCP mid July for diarrhea. She described black stools but no stool in vault on exam so guaiac test was inconclusive. Hgb was around baseline. Stool pathogen panel was negative. Hemoccults x3 were all negative. Lomotil prescribed.  At follow up appt 09/03/14 patient prescribed Xanax for anxiety, she had been under stress at home.   Patient presented to ED 09/11/14 for ongoing symptoms. At that time she also reported vomiting and weight loss.  Non-contrast CT scan unrevealing ( except for some bilateral nonobstructing renal stones). Patient here with her daughter who resides in [place]. Patient has not vomited in a week, twice daily Zofran controls the nausea. She is still having loose stools. Lomotil has decreased the frequency of BMs,  averaging 3 loose bowel movements a day now. No nocturnal diarrhea, no associated cramps, no blood in her stool.. Patient is surviving off of oatmeal, potatoes, and bananas. In October 2015 her weight was 161, it is 143 today. She lost ten of those pounds just since mid July when all of her gastrointestinal problems began. Patient cannot relate her gastrointestinal symptoms to any medication changes. She's had no out of the country travel.  Current Medications, Allergies, Past Medical History, Past Surgical History, Family History and Social History were reviewed in [employer] record.  Studies:   Ct Abdomen Pelvis Wo Contrast  09/11/2014   CLINICAL DATA:   Nausea, vomiting and diarrhea.  Weight loss.  EXAM: CT ABDOMEN AND PELVIS WITHOUT CONTRAST  TECHNIQUE: Multidetector CT imaging of the abdomen and pelvis was performed following the standard protocol without IV contrast.  COMPARISON:  CT 07/13/2008  FINDINGS: Lower chest: Minimal atelectasis at the right lung base. Physiologic pericardial fluid.  Liver: Unremarkable unenhanced appearance.  Hepatobiliary: Gallbladder physiologically distended. No biliary dilatation.  Pancreas: No ductal dilatation or surrounding inflammatory change.  Spleen: Normal.  Adrenal glands: No nodule.  Kidneys: Bilateral nonobstructing renal calculi. Multiple cysts on prior exam are less well seen currently given lack of intravenous contrast. Extrarenal pelvis configuration of the right kidney. No hydronephrosis or perinephric stranding.  Stomach/Bowel: Stomach is decompressed. There are no dilated or thickened small bowel loops. Small volume of stool throughout the colon without colonic wall thickening. Sigmoid colon is tortuous. The appendix is normal.  Vascular/Lymphatic: No retroperitoneal adenopathy. Abdominal aorta is normal in caliber.  Reproductive: Enlarged heterogeneous fibroid uterus, however decreased in size from prior exam. No adnexal mass.  Bladder: Physiologically distended.  Other: No free air, free fluid, or intra-abdominal fluid collection.  Musculoskeletal: There are no acute or suspicious osseous abnormalities. Degenerative disc disease in the lumbar spine.  IMPRESSION: 1. No acute abnormality in the abdomen/pelvis. 2. Bilateral nonobstructing renal calculi. The bilateral renal cysts on prior exam are not as well defined currently due to lack of contrast. 3. Fibroid uterus, however decreased in size from prior exam.   Electronically Signed   By: Jeb Levering M.D.   On: 09/11/2014 04:14    Physical Exam: General: Pleasant, well developed , black  female in no acute distress Head: Normocephalic and  atraumatic Eyes:  sclerae anicteric, conjunctiva pink  Ears: Normal auditory acuity Lungs: Clear throughout to auscultation Heart: Regular rate and rhythm Abdomen: Soft, non distended, non-tender. No masses, no hepatomegaly. Normal bowel sounds Musculoskeletal: Symmetrical with no gross deformities  Extremities: No edema  Neurological: Alert oriented x 4, grossly nonfocal Psychological:  Alert and cooperative. Normal mood and affect  Assessment and Recommendations:   1. Pleasant 64 year old female with several week history of loose stool associated with nausea, vomiting and weight loss. Stool studies negative. Noncontrast CT unremarkable. Rule out IBS flare, maybe even post-infectious IBS? Patient does not look toxic and her abdominal exam is unremarkable so we have some time to sort this out.   Vomiting has ceased but still taking twice daily Zofran to control nausea.   May increase Lomotil to 3 times a day, hold for constipation  Trial of a probiotic, Align recommended.   Patient will call with a condition update in 10 days. Further recommendations will depend on clinical course (persistent diarrhea, vomiting and /or weight loss)   2. Acute renal insufficiency. Recheck bmet today. Hopefully creatinine has improved since vomiting subsided and diarrhea slowed with Lomotil   Addendum: Reviewed and agree with initial management.  If persistent may consider repeat cross-sectional imaging with IV contrast and possibly with enteroscopy Jerene Bears, MD

## 2014-10-15 ENCOUNTER — Other Ambulatory Visit: Payer: Self-pay | Admitting: Internal Medicine

## 2014-10-15 DIAGNOSIS — R197 Diarrhea, unspecified: Secondary | ICD-10-CM

## 2014-10-15 MED ORDER — ONDANSETRON 4 MG PO TBDP: 4.0000 mg | ORAL_TABLET | Freq: Three times a day (TID) | ORAL | Status: DC | PRN

## 2014-10-15 MED ORDER — DIPHENOXYLATE-ATROPINE 2.5-0.025 MG PO TABS: 1.0000 | ORAL_TABLET | Freq: Four times a day (QID) | ORAL | Status: DC | PRN

## 2014-10-15 NOTE — Progress Notes (Signed)
Called by daughter requesting refills of lomotil and zofran Refills provided Office to call and check on pt next week, followup if not improved Saw paula guenther recently

## 2014-10-19 ENCOUNTER — Telehealth: Payer: Self-pay | Admitting: Nurse Practitioner

## 2014-10-19 NOTE — Telephone Encounter (Signed)
Gina Levine, she is requesting Zofran so I assume she is still having problems with nausea?  If so then lets set her up for EGD. Is her diarrhea severe (multiple times a day?). She had a colonoscopy 2 years ago, I really don't want to repeat it yet. Also, is she still losing weight? Thanks

## 2014-10-19 NOTE — Telephone Encounter (Signed)
Gina Levine see note and advise

## 2014-10-19 NOTE — Progress Notes (Signed)
Called and left message for pt to call back.

## 2014-10-19 NOTE — Telephone Encounter (Signed)
Left message for patient to call back  

## 2014-10-20 NOTE — Telephone Encounter (Signed)
Patient reports that she is not having nausea or vomiting, but is having diarrhea still.  Her weight is stable at 135 lbs.  Please advise

## 2014-10-21 NOTE — Telephone Encounter (Signed)
Patient asking about the next step .  Please advise

## 2014-10-21 NOTE — Progress Notes (Signed)
Spoke with pt and she states she is still having problems with food "going straight through her." States she is not able to eat normally or she vomits. Discussed with pt that Dr. Hilarie Fredrickson wanted her to be seen. Pt will call back when she knows what day her daughter can bring her for an appt.

## 2014-10-24 ENCOUNTER — Other Ambulatory Visit: Payer: Self-pay | Admitting: Family Medicine

## 2014-10-26 NOTE — Telephone Encounter (Signed)
Left message for patient to call back  

## 2014-10-27 NOTE — Telephone Encounter (Signed)
Patient is still having issues.  Per Tye Savoy RNP needs follow up She is scheduled for 11/02/14 3:00 with Alonza Bogus, PA

## 2014-10-28 NOTE — Progress Notes (Signed)
Pt scheduled to see Alonza Bogus PA 11/02/14.

## 2014-11-02 ENCOUNTER — Encounter: Payer: Self-pay | Admitting: Gastroenterology

## 2014-11-02 ENCOUNTER — Ambulatory Visit (INDEPENDENT_AMBULATORY_CARE_PROVIDER_SITE_OTHER): Payer: BLUE CROSS/BLUE SHIELD | Admitting: Gastroenterology

## 2014-11-02 VITALS — BP 122/84 | HR 76 | Ht 61.0 in | Wt 143.2 lb

## 2014-11-02 DIAGNOSIS — R197 Diarrhea, unspecified: Secondary | ICD-10-CM

## 2014-11-02 MED ORDER — LOPERAMIDE HCL 2 MG PO CAPS: ORAL_CAPSULE | ORAL | Status: DC

## 2014-11-02 NOTE — Patient Instructions (Addendum)
We sent a prescription for Imodium with instructions to [facility] [place], [place].  Take Imodium 3 times daily x 7 days, then twice daily x 7 days, then once daily x 7 days then off as long as diarrhea does not return.   Call us with any concerns or questions. You can ask for Alyson Locket nurse.

## 2014-11-02 NOTE — Progress Notes (Addendum)
     11/02/2014 [name] [id number] 07/09/1950   History of Present Illness:  This is a 64 year old female who is here for follow-up of her nausea, vomiting, diarrhea, and weight loss.  Gina Distance, NP on 8/17 (see note for details).  Says that she feels much better.  Nausea and vomiting have resolved.  Weight is stable since last visit.  No further diarrhea but she is still taking Imodium three times daily.  She just ran out of the medication and she is asking if she can discontinue it.  Upper endoscopy and colonoscopy 10/2012. EGD revealed mild acute gastritis (biopsies normal). Colonoscopy was normal other than one 3 mm sessile ascending colon  " polyp" (biopsy negative for adenomatous tissue).   Current Medications, Allergies, Past Medical History, Past Surgical History, Family History and Social History were reviewed in [employer] record.   Physical Exam: BP 122/84 mmHg  Pulse 76  Ht 5\' 1"  (1.549 m)  Wt 143 lb 4 oz (64.978 kg)  BMI 27.08 kg/m2 General: Well developed black female in no acute distress Head: Normocephalic and atraumatic Eyes:  Sclerae anicteric, conjunctiva pink  Ears: Normal auditory acuity Lungs: Clear throughout to auscultation Heart: Regular rate and rhythm Abdomen: Soft, non-distended.  Normal bowel sounds.  Non-tender. Musculoskeletal: Symmetrical with no gross deformities  Extremities: No edema  Neurological: Alert oriented x 4, grossly non-focal Psychological:  Alert and cooperative. Normal mood and affect  Assessment and Recommendations: *64 year old female with several week history of loose stools associated with nausea, vomiting, and weight loss. Stool studies negative. Noncontrast CT unremarkable.  ? IBS.  Here for follow-up.  Weight is stable.  Nausea and vomiting resolved.  Diarrhea resolved but still taking Imodium TID.  Asking if she can decrease/discontinue use.  We will have her slowly decrease to BID then once  daily and then discontinue over several days as long as diarrhea does not return.   Addendum: Reviewed and agree with management. Jerene Bears, MD

## 2014-12-04 ENCOUNTER — Other Ambulatory Visit: Payer: Self-pay | Admitting: Family Medicine

## 2014-12-07 ENCOUNTER — Other Ambulatory Visit: Payer: Self-pay | Admitting: Family Medicine

## 2014-12-08 NOTE — Telephone Encounter (Signed)
Medication refilled per protocol. 

## 2015-02-12 ENCOUNTER — Other Ambulatory Visit: Payer: Self-pay | Admitting: Family Medicine

## 2015-02-13 DIAGNOSIS — M8580 Other specified disorders of bone density and structure, unspecified site: Secondary | ICD-10-CM

## 2015-02-13 HISTORY — DX: Other specified disorders of bone density and structure, unspecified site: M85.80

## 2015-03-28 LAB — HM MAMMOGRAPHY: HM Mammogram: NORMAL

## 2015-03-30 ENCOUNTER — Encounter: Payer: Self-pay | Admitting: Family Medicine

## 2015-03-30 DIAGNOSIS — Z0000 Encounter for general adult medical examination without abnormal findings: Secondary | ICD-10-CM

## 2015-03-30 DIAGNOSIS — I10 Essential (primary) hypertension: Secondary | ICD-10-CM

## 2015-03-30 DIAGNOSIS — Z79899 Other long term (current) drug therapy: Secondary | ICD-10-CM

## 2015-03-30 DIAGNOSIS — E559 Vitamin D deficiency, unspecified: Secondary | ICD-10-CM

## 2015-04-14 ENCOUNTER — Ambulatory Visit (INDEPENDENT_AMBULATORY_CARE_PROVIDER_SITE_OTHER): Payer: Medicare Other | Admitting: Physician Assistant

## 2015-04-14 ENCOUNTER — Other Ambulatory Visit: Payer: Self-pay | Admitting: Physician Assistant

## 2015-04-14 ENCOUNTER — Encounter: Payer: Self-pay | Admitting: Physician Assistant

## 2015-04-14 VITALS — BP 104/60 | HR 68 | Temp 98.6°F | Resp 18 | Ht 61.0 in | Wt 147.0 lb

## 2015-04-14 DIAGNOSIS — Z0000 Encounter for general adult medical examination without abnormal findings: Secondary | ICD-10-CM

## 2015-04-14 DIAGNOSIS — Z23 Encounter for immunization: Secondary | ICD-10-CM

## 2015-04-14 LAB — TSH: TSH: 0.68 mIU/L

## 2015-04-14 LAB — COMPLETE METABOLIC PANEL WITH GFR
ALT: 11 U/L (ref 6–29)
AST: 15 U/L (ref 10–35)
Albumin: 4.3 g/dL (ref 3.6–5.1)
Alkaline Phosphatase: 70 U/L (ref 33–130)
BUN: 23 mg/dL (ref 7–25)
CO2: 26 mmol/L (ref 20–31)
Calcium: 9.6 mg/dL (ref 8.6–10.4)
Chloride: 104 mmol/L (ref 98–110)
Creat: 1.17 mg/dL — ABNORMAL HIGH (ref 0.50–0.99)
GFR, Est African American: 57 mL/min — ABNORMAL LOW (ref >=60)
GFR, Est Non African American: 49 mL/min — ABNORMAL LOW (ref >=60)
Glucose, Bld: 84 mg/dL (ref 70–99)
Potassium: 4.1 mmol/L (ref 3.5–5.3)
Sodium: 140 mmol/L (ref 135–146)
Total Bilirubin: 0.6 mg/dL (ref 0.2–1.2)
Total Protein: 7.4 g/dL (ref 6.1–8.1)

## 2015-04-14 LAB — CBC WITH DIFFERENTIAL/PLATELET
Basophils Absolute: 0.1 10*3/uL (ref 0.0–0.1)
Basophils Relative: 1 % (ref 0–1)
Eosinophils Absolute: 0.3 10*3/uL (ref 0.0–0.7)
Eosinophils Relative: 5 % (ref 0–5)
HCT: 34 % — ABNORMAL LOW (ref 36.0–46.0)
Hemoglobin: 10.7 g/dL — ABNORMAL LOW (ref 12.0–15.0)
Lymphocytes Relative: 45 % (ref 12–46)
Lymphs Abs: 2.6 10*3/uL (ref 0.7–4.0)
MCH: 29.3 pg (ref 26.0–34.0)
MCHC: 31.5 g/dL (ref 30.0–36.0)
MCV: 93.2 fL (ref 78.0–100.0)
MPV: 11.4 fL (ref 8.6–12.4)
Monocytes Absolute: 0.3 10*3/uL (ref 0.1–1.0)
Monocytes Relative: 6 % (ref 3–12)
Neutro Abs: 2.5 10*3/uL (ref 1.7–7.7)
Neutrophils Relative %: 43 % (ref 43–77)
Platelets: 282 10*3/uL (ref 150–400)
RBC: 3.65 MIL/uL — ABNORMAL LOW (ref 3.87–5.11)
RDW: 12.8 % (ref 11.5–15.5)
WBC: 5.8 10*3/uL (ref 4.0–10.5)

## 2015-04-14 NOTE — Progress Notes (Signed)
Patient ID: Gina Levine MRN: [id number], DOB: [birth year], 65 y.o. Date of Encounter: 04/14/2015,   Chief Complaint: Physical (CPE)  HPI: 65 y.o. y/o female  here for CPE.   She has no complaints or concerns today.  Review of Systems: Consitutional: No fever, chills, fatigue, night sweats, lymphadenopathy. No significant/unexplained weight changes. Eyes: No visual changes, eye redness, or discharge. ENT/Mouth: No ear pain, sore throat, nasal drainage, or sinus pain. Cardiovascular: No chest pressure,heaviness, tightness or squeezing, even with exertion. No increased shortness of breath or dyspnea on exertion.No palpitations, edema, orthopnea, PND. Respiratory: No cough, hemoptysis, SOB, or wheezing. Gastrointestinal: No anorexia, dysphagia, reflux, pain, nausea, vomiting, hematemesis, diarrhea, constipation, BRBPR, or melena. Breast: No mass, nodules, bulging, or retraction. No skin changes or inflammation. No nipple discharge. No lymphadenopathy. Genitourinary: No dysuria, hematuria, incontinence, vaginal discharge, pruritis, burning, abnormal bleeding, or pain. Musculoskeletal: No decreased ROM, No joint pain or swelling. No significant pain in neck, back, or extremities. Skin: No rash, pruritis, or concerning lesions. Neurological: No headache, dizziness, syncope, seizures, tremors, memory loss, coordination problems, or paresthesias. Psychological: No anxiety, depression, hallucinations, SI/HI. Endocrine: No polydipsia, polyphagia, polyuria, or known diabetes.No increased fatigue. No palpitations/rapid heart rate. No significant/unexplained weight change. All other systems were reviewed and are otherwise negative.  Past Medical History  Diagnosis Date  . Hypertension   . Depression   . Nephrolithiasis   . Anemia      Past Surgical History  Procedure Laterality Date  . Lithotripsy      x 2    Home Meds:  Outpatient Prescriptions Prior to Visit  Medication Sig  Dispense Refill  . amLODipine (NORVASC) 10 MG tablet TAKE 1 TABLET BY MOUTH DAILY 90 tablet 3  . cholecalciferol (VITAMIN D) 1000 UNITS tablet Take 1,000 Units by mouth daily.    . hydrochlorothiazide (HYDRODIURIL) 25 MG tablet TAKE 1 TABLET BY MOUTH EVERY DAY 90 tablet 1  . ondansetron (ZOFRAN ODT) 4 MG disintegrating tablet Take 1 tablet (4 mg total) by mouth every 8 (eight) hours as needed for nausea or vomiting. 20 tablet 0  . omeprazole (PRILOSEC) 20 MG capsule TAKE ONE CAPSULE EVERY DAY (Patient not taking: Reported on 04/14/2015) 90 capsule 3  . famotidine (PEPCID) 20 MG tablet TAKE 1 TABLET TWICE A DAY (Patient not taking: Reported on 11/02/2014) 60 tablet 11  . loperamide (IMODIUM) 2 MG capsule Take 1 tab 3 times  daily x 7 days, then 1 tab twice daily x 7 days, then once daily x 7 days.then off. 42 capsule 1   No facility-administered medications prior to visit.    Allergies:  Allergies  Allergen Reactions  . Benazepril Other (See Comments)    "makes toes swell"     Social History   Social History  . Marital Status: Married    Spouse Name: N/A  . Number of Children: 4  . Years of Education: N/A   Occupational History  . Retired    Social History Main Topics  . Smoking status: Never Smoker   . Smokeless tobacco: Never Used  . Alcohol Use: No  . Drug Use: No  . Sexual Activity: Not on file   Other Topics Concern  . Not on file   Social History Narrative    Family History  Problem Relation Age of Onset  . Cerebrovascular Accident      Physical Exam: Blood pressure 104/60, pulse 68, temperature 98.6 F (37 C), temperature source Oral, resp. rate 18, height 5'  1" (1.549 m), weight 147 lb (66.679 kg)., Body mass index is 27.79 kg/(m^2). General: Well developed, well nourished, in no acute distress. HEENT: Normocephalic, atraumatic. Conjunctiva pink, sclera non-icteric. Pupils 2 mm constricting to 1 mm, round, regular, and equally reactive to light and  accomodation. EOMI. Internal auditory canal clear. TMs with good cone of light and without pathology. Nasal mucosa pink. Nares are without discharge. No sinus tenderness. Oral mucosa pink.  Pharynx without exudate.   Neck: Supple. Trachea midline. No thyromegaly. Full ROM. No lymphadenopathy.No Carotid Bruits. Lungs: Clear to auscultation bilaterally without wheezes, rales, or rhonchi. Breathing is of normal effort and unlabored. Cardiovascular: RRR with S1 S2. No murmurs, rubs, or gallops. Distal pulses 2+ symmetrically. No carotid or abdominal bruits. Breast: Symmetrical. No masses. Nipples without discharge. Abdomen: Soft, non-tender, non-distended with normoactive bowel sounds. No hepatosplenomegaly or masses. No rebound/guarding. No CVA tenderness. No hernias.  Genitourinary:  External genitalia without lesions. Vaginal mucosa pink.No discharge present. Cervix pink and without discharge. No cervical tenderness.Normal uterus size. No adnexal mass or tenderness. Musculoskeletal: Full range of motion and 5/5 strength throughout. Without swelling, atrophy, tenderness, crepitus, or warmth. Extremities without clubbing, cyanosis, or edema. Calves supple. Skin: Warm and moist without erythema, ecchymosis, wounds, or rash. Neuro: A+Ox3. CN II-XII grossly intact. Moves all extremities spontaneously. Full sensation throughout. Normal gait. DTR 2+ throughout upper and lower extremities. Finger to nose intact. Psych:  Responds to questions appropriately with a normal affect.   Assessment/Plan:  65 y.o. y/o female here for CPE 1. Visit for preventive health examination  A. Screening Labs:  She is not fasting today but she can just skip FLP for this year and do other labs while she is here rather than return fasting. Her lipid panel has been excellent the past 2 years. 2 years ago lipid panel showed cholesterol 196, triglyceride 134, HDL 48, LDL 121. 1 year ago lipid panel showed cholesterol 198,  triglyceride 91, HDL 57, LDL 123. - CBC with Differential/Platelet - COMPLETE METABOLIC PANEL WITH GFR - TSH - VITAMIN D 25 Hydroxy (Vit-D Deficiency, Fractures)  B. Pap: She had Pap smear performed here 12/01/13. HPV negative. Cytology negative. Can wait 3-5 years to repeat.  C. Screening Mammogram: She had mammogram performed in [place]-- 03/28/15-- negative. (report scanned in--reviewed today)  D. DEXA/BMD:  She reports that she has never had a bone density scan. She reports that she does not want to go to [place]-- that she only goes there for her mammogram. However [place] the [place] near [place] so will schedule bone density scan for [place]. - DG Bone Density; Future  E. Colorectal Cancer Screening: Last colonoscopy was 2014--- normal  F. Immunizations:  Influenza:---N/A Tetanus:  She received T dap 11/19/2012 Pneumococcal: She has had no pneumonia vaccine. Given that she is now age 27 will give Prevnar 28 today. 6 -12 months later will give Pneumovax 23. Zostavax: She states that she has not received Zostavax and has not checked with her insurance regarding cost. Wrote on her AVS to remind her and discussed this process with her. She will call her insurance to find out cost and then call us and let us know that information and whether she wants to get the vaccine.  Subjective:   Patient presents for Medicare Annual/Subsequent preventive examination.   Review Past Medical/Family/Social: All of this information is reviewed and documented in epic and in above note today.  Risk Factors  Current exercise habits:  She is very active.  She helps care for her sister who has Alzheimer's. Dietary issues discussed: She eats a low-sodium low-cholesterol diet.  Cardiac risk factors: Age  Depression Screen  (Note: if answer to either of the following is "Yes", a more complete depression screening is indicated)  Over the past two weeks, have you felt down,  depressed or hopeless? No Over the past two weeks, have you felt little interest or pleasure in doing things? No Have you lost interest or pleasure in daily life? No Do you often feel hopeless? No Do you cry easily over simple problems? No   Activities of Daily Living  In your present state of health, do you have any difficulty performing the following activities?:  Driving? No  Managing money? No  Feeding yourself? No  Getting from bed to chair? No  Climbing a flight of stairs? No  Preparing food and eating?: No  Bathing or showering? No  Getting dressed: No  Getting to the toilet? No  Using the toilet:No  Moving around from place to place: No  In the past year have you fallen or had a near fall?:No  Are you sexually active? No  Do you have more than one partner? No   Hearing Difficulties: No  Do you often ask people to speak up or repeat themselves? No  Do you experience ringing or noises in your ears? No Do you have difficulty understanding soft or whispered voices? No  Do you feel that you have a problem with memory? No Do you often misplace items? No  Do you feel safe at home? Yes  Cognitive Testing  Alert? Yes Normal Appearance?Yes  Oriented to person? Yes Place? Yes  Time? Yes  Recall of three objects? Yes  Can perform simple calculations? Yes  Displays appropriate judgment?Yes  Can read the correct time from a watch face?Yes   List the Names of Other Physician/Practitioners you currently use:  none  Indicate any recent Medical Services you may have received from other than [facility] providers in the past year (date may be approximate).  none Screening Tests / Date--All of this informationis documented above.  Colonoscopy                     Zostavax  Mammogram  Influenza Vaccine  Tetanus/tdap    Assessment:    Annual wellness medicare exam   Plan:    During the course of the visit the patient was educated and counseled about appropriate screening and  preventive services including:  Screening mammography  Colorectal cancer screening  Shingles vaccine. Prescription given to that she can get the vaccine at the pharmacy or Medicare part D.  Screen + for depression. PHQ- 9 score of 12 (moderate depression). We discussed the options of counseling versus possibly a medication. I encouraged her strongly think about the counseling. She is going through some medical problems currently and her husband is as well Mrs. been very stressful for her. She says she will think about it. She does have Xanax to use as needed. Though she may benefit from an SSRI for her more depressive type symptoms but she wants to hold off at this time.  I aksed her to please have her cardioloist send records since we have none on file.  Diet review for nutrition referral? Yes ____ Not Indicated __x__  Patient Instructions (the written plan) was given to the patient.  Medicare Attestation  I have personally reviewed:  The patient's medical and social history  Their use  of alcohol, tobacco or illicit drugs  Their current medications and supplements  The patient's functional ability including ADLs,fall risks, home safety risks, cognitive, and hearing and visual impairment  Diet and physical activities  Evidence for depression or mood disorders  The patient's weight, height, BMI, and visual acuity have been recorded in the chart. I have made referrals, counseling, and provided education to the patient based on review of the above and I have provided the patient with a written personalized care plan for preventive services.       Signed, [address] [place], [place], [facility] 04/14/2015 9:53 AM

## 2015-04-15 LAB — VITAMIN D 25 HYDROXY (VIT D DEFICIENCY, FRACTURES): Vit D, 25-Hydroxy: 26 ng/mL — ABNORMAL LOW (ref 30–100)

## 2015-04-16 LAB — FOLATE: Folate: 14.8 ng/mL (ref >5.4)

## 2015-04-16 LAB — RETICULOCYTES
ABS Retic: 40.6 10*3/uL (ref 19.0–186.0)
RBC.: 3.69 MIL/uL — ABNORMAL LOW (ref 3.87–5.11)
Retic Ct Pct: 1.1 % (ref 0.4–2.3)

## 2015-04-16 LAB — IRON AND TIBC
%SAT: 38 % (ref 11–50)
Iron: 110 ug/dL (ref 45–160)
TIBC: 289 ug/dL (ref 250–450)
UIBC: 179 ug/dL (ref 125–400)

## 2015-04-16 LAB — VITAMIN B12: Vitamin B-12: 323 pg/mL (ref 200–1100)

## 2015-04-16 LAB — FERRITIN: Ferritin: 304 ng/mL — ABNORMAL HIGH (ref 20–288)

## 2015-05-11 ENCOUNTER — Encounter: Payer: Self-pay | Admitting: *Deleted

## 2015-05-23 ENCOUNTER — Ambulatory Visit (HOSPITAL_COMMUNITY)
Admission: RE | Admit: 2015-05-23 | Discharge: 2015-05-23 | Disposition: A | Discharge status: Home / Self Care | Payer: Medicare Other | Source: Ambulatory Visit | Attending: Physician Assistant | Admitting: Physician Assistant

## 2015-05-23 DIAGNOSIS — M85851 Other specified disorders of bone density and structure, right thigh: Secondary | ICD-10-CM | POA: Diagnosis not present

## 2015-05-23 DIAGNOSIS — M8588 Other specified disorders of bone density and structure, other site: Secondary | ICD-10-CM | POA: Insufficient documentation

## 2015-05-23 DIAGNOSIS — Z0000 Encounter for general adult medical examination without abnormal findings: Secondary | ICD-10-CM | POA: Diagnosis present

## 2015-05-24 ENCOUNTER — Encounter: Payer: Self-pay | Admitting: Family Medicine

## 2015-05-24 DIAGNOSIS — M8580 Other specified disorders of bone density and structure, unspecified site: Secondary | ICD-10-CM | POA: Insufficient documentation

## 2015-05-25 ENCOUNTER — Encounter: Payer: Self-pay | Admitting: Family Medicine

## 2015-11-11 ENCOUNTER — Other Ambulatory Visit: Payer: Self-pay | Admitting: Family Medicine

## 2015-11-17 ENCOUNTER — Other Ambulatory Visit: Payer: Self-pay | Admitting: Family Medicine

## 2015-11-24 ENCOUNTER — Ambulatory Visit (INDEPENDENT_AMBULATORY_CARE_PROVIDER_SITE_OTHER): Payer: Medicare Other | Admitting: *Deleted

## 2015-11-24 DIAGNOSIS — Z23 Encounter for immunization: Secondary | ICD-10-CM | POA: Diagnosis not present

## 2015-11-24 NOTE — Progress Notes (Signed)
Patient seen in office for Influenza Vaccination.   Tolerated IM administration well.   Immunization history updated.  

## 2016-01-13 ENCOUNTER — Other Ambulatory Visit: Payer: Self-pay | Admitting: Family Medicine

## 2016-03-19 ENCOUNTER — Encounter: Payer: Self-pay | Admitting: Family Medicine

## 2016-03-19 ENCOUNTER — Ambulatory Visit (INDEPENDENT_AMBULATORY_CARE_PROVIDER_SITE_OTHER): Payer: Medicare Other | Admitting: Family Medicine

## 2016-03-19 VITALS — BP 150/80 | HR 84 | Temp 98.1°F | Resp 16 | Ht 62.0 in | Wt 153.0 lb

## 2016-03-19 DIAGNOSIS — Z23 Encounter for immunization: Secondary | ICD-10-CM

## 2016-03-19 DIAGNOSIS — R030 Elevated blood-pressure reading, without diagnosis of hypertension: Secondary | ICD-10-CM | POA: Diagnosis not present

## 2016-03-19 DIAGNOSIS — Z0000 Encounter for general adult medical examination without abnormal findings: Secondary | ICD-10-CM

## 2016-03-19 NOTE — Progress Notes (Signed)
Subjective:    Patient ID: Gina Levine, female    DOB: Mar 30, 1950, 66 y.o.   MRN: [id number]  HPI Patient is here today for complete physical exam. Her last mammogram was in February of last year. She is already scheduled her mammogram for this year. She is over 65 and therefore does not require a Pap smear. Her colonoscopy is up-to-date. She is due for a flu shot as well as a booster on her pneumonia vaccine. Immunization History  Administered Date(s) Administered  . Influenza,inj,Quad PF,36+ Mos 11/19/2012, 12/01/2013, 11/24/2015  . Pneumococcal Conjugate-13 04/14/2015  . Pneumococcal Polysaccharide-23 03/19/2016  . Tdap 11/19/2012   Blood pressure today is elevated but she's been rushing around and she is little bit nervous. Otherwise she is doing well with no concerns Past Medical History:  Diagnosis Date  . Anemia   . Depression   . Hypertension   . Nephrolithiasis   . Osteopenia 2017   T=-1.7   Past Surgical History:  Procedure Laterality Date  . LITHOTRIPSY     x 2   Current Outpatient Prescriptions on File Prior to Visit  Medication Sig Dispense Refill  . amLODipine (NORVASC) 10 MG tablet TAKE 1 TABLET EVERY DAY 90 tablet 3  . cholecalciferol (VITAMIN D) 1000 UNITS tablet Take 1,000 Units by mouth daily.    . famotidine (PEPCID) 20 MG tablet TAKE 1 TABLET TWICE A DAY 60 tablet 10  . hydrochlorothiazide (HYDRODIURIL) 25 MG tablet TAKE 1 TABLET BY MOUTH EVERY DAY 90 tablet 1   No current facility-administered medications on file prior to visit.    Allergies  Allergen Reactions  . Benazepril Other (See Comments)    "makes toes swell"    Social History   Social History  . Marital status: Married    Spouse name: N/A  . Number of children: 4  . Years of education: N/A   Occupational History  . Retired    Social History Main Topics  . Smoking status: Never Smoker  . Smokeless tobacco: Never Used  . Alcohol use No  . Drug use: No  . Sexual  activity: Not on file   Other Topics Concern  . Not on file   Social History Narrative  . No narrative on file   Family History  Problem Relation Age of Onset  . Cerebrovascular Accident        Review of Systems  All other systems reviewed and are negative.      Objective:   Physical Exam  Constitutional: She is oriented to person, place, and time. She appears well-developed and well-nourished. No distress.  HENT:  Head: Normocephalic and atraumatic.  Right Ear: External ear normal.  Left Ear: External ear normal.  Nose: Nose normal.  Mouth/Throat: Oropharynx is clear and moist. No oropharyngeal exudate.  Eyes: Conjunctivae and EOM are normal. Pupils are equal, round, and reactive to light. Right eye exhibits no discharge. Left eye exhibits no discharge. No scleral icterus.  Neck: Normal range of motion. Neck supple. No JVD present. No tracheal deviation present. No thyromegaly present.  Cardiovascular: Normal rate, regular rhythm, normal heart sounds and intact distal pulses.  Exam reveals no gallop and no friction rub.   No murmur heard. Pulmonary/Chest: Effort normal and breath sounds normal. No stridor. No respiratory distress. She has no wheezes. She has no rales. She exhibits no tenderness.  Abdominal: Soft. Bowel sounds are normal. She exhibits no distension and no mass. There is no tenderness. There is no rebound  and no guarding.  Musculoskeletal: Normal range of motion. She exhibits no edema, tenderness or deformity.  Lymphadenopathy:    She has no cervical adenopathy.  Neurological: She is alert and oriented to person, place, and time. She has normal reflexes. She displays normal reflexes. No cranial nerve deficit. She exhibits normal muscle tone. Coordination normal.  Skin: Skin is warm. No rash noted. She is not diaphoretic. No erythema. No pallor.  Psychiatric: She has a normal mood and affect. Her behavior is normal. Judgment and thought content normal.  Vitals  reviewed.         Assessment & Plan:  Need for prophylactic vaccination against Streptococcus pneumoniae (pneumococcus) - Plan: Pneumococcal polysaccharide vaccine 23-valent greater than or equal to 2yo subcutaneous/IM  Visit for preventive health examination - Plan: CBC with Differential/Platelet, Lipid panel, COMPLETE METABOLIC PANEL WITH GFR  Elevated blood pressure reading - Plan: CBC with Differential/Platelet, Lipid panel, COMPLETE METABOLIC PANEL WITH GFR  Physical exam today is normal. Blood pressures elevated. Patient will have his recheck her blood pressure tomorrow when she comes in for fasting lab work including a CBC, CMP, fasting lipid panel. Immunizations were updated today. She is her a schedule her mammogram. Colonoscopy is up-to-date and she does not require a Pap smear.

## 2016-03-20 ENCOUNTER — Other Ambulatory Visit: Payer: Medicare Other

## 2016-03-20 ENCOUNTER — Ambulatory Visit: Payer: Medicare Other | Admitting: Family Medicine

## 2016-03-20 VITALS — BP 114/70

## 2016-03-20 DIAGNOSIS — R030 Elevated blood-pressure reading, without diagnosis of hypertension: Secondary | ICD-10-CM

## 2016-03-20 LAB — LIPID PANEL
Cholesterol: 175 mg/dL (ref <200)
HDL: 66 mg/dL (ref >50)
LDL Cholesterol: 94 mg/dL (ref <100)
Total CHOL/HDL Ratio: 2.7 Ratio (ref <5.0)
Triglycerides: 75 mg/dL (ref <150)
VLDL: 15 mg/dL (ref <30)

## 2016-03-20 LAB — COMPLETE METABOLIC PANEL WITH GFR
ALT: 11 U/L (ref 6–29)
AST: 15 U/L (ref 10–35)
Albumin: 4.5 g/dL (ref 3.6–5.1)
Alkaline Phosphatase: 56 U/L (ref 33–130)
BUN: 23 mg/dL (ref 7–25)
CO2: 27 mmol/L (ref 20–31)
Calcium: 9.5 mg/dL (ref 8.6–10.4)
Chloride: 104 mmol/L (ref 98–110)
Creat: 1.29 mg/dL — ABNORMAL HIGH (ref 0.50–0.99)
GFR, Est African American: 50 mL/min — ABNORMAL LOW (ref >=60)
GFR, Est Non African American: 43 mL/min — ABNORMAL LOW (ref >=60)
Glucose, Bld: 76 mg/dL (ref 70–99)
Potassium: 4 mmol/L (ref 3.5–5.3)
Sodium: 141 mmol/L (ref 135–146)
Total Bilirubin: 1 mg/dL (ref 0.2–1.2)
Total Protein: 7.2 g/dL (ref 6.1–8.1)

## 2016-03-20 LAB — CBC WITH DIFFERENTIAL/PLATELET
Basophils Absolute: 0 cells/uL (ref 0–200)
Basophils Relative: 0 %
Eosinophils Absolute: 270 cells/uL (ref 15–500)
Eosinophils Relative: 3 %
HCT: 33 % — ABNORMAL LOW (ref 35.0–45.0)
Hemoglobin: 10.4 g/dL — ABNORMAL LOW (ref 12.0–15.0)
Lymphocytes Relative: 25 %
Lymphs Abs: 2250 cells/uL (ref 850–3900)
MCH: 29.8 pg (ref 27.0–33.0)
MCHC: 31.5 g/dL — ABNORMAL LOW (ref 32.0–36.0)
MCV: 94.6 fL (ref 80.0–100.0)
MPV: 10.8 fL (ref 7.5–12.5)
Monocytes Absolute: 540 cells/uL (ref 200–950)
Monocytes Relative: 6 %
Neutro Abs: 5940 cells/uL (ref 1500–7800)
Neutrophils Relative %: 66 %
Platelets: 263 10*3/uL (ref 140–400)
RBC: 3.49 MIL/uL — ABNORMAL LOW (ref 3.80–5.10)
RDW: 12.8 % (ref 11.0–15.0)
WBC: 9 10*3/uL (ref 3.8–10.8)

## 2016-04-03 LAB — HM MAMMOGRAPHY

## 2016-05-14 ENCOUNTER — Other Ambulatory Visit: Payer: Self-pay | Admitting: Family Medicine

## 2016-07-12 ENCOUNTER — Other Ambulatory Visit: Payer: Self-pay | Admitting: Family Medicine

## 2016-11-28 ENCOUNTER — Other Ambulatory Visit: Payer: Self-pay | Admitting: Family Medicine

## 2016-11-28 NOTE — Telephone Encounter (Signed)
Medication refill for one time only.  Patient needs to be seen.  Letter sent for patient to call and schedule 

## 2016-12-11 ENCOUNTER — Ambulatory Visit (INDEPENDENT_AMBULATORY_CARE_PROVIDER_SITE_OTHER): Payer: Medicare Other | Admitting: Family Medicine

## 2016-12-11 ENCOUNTER — Encounter: Payer: Self-pay | Admitting: Family Medicine

## 2016-12-11 VITALS — BP 138/70 | HR 74 | Temp 97.9°F | Resp 14 | Ht 62.0 in | Wt 160.0 lb

## 2016-12-11 DIAGNOSIS — Z23 Encounter for immunization: Secondary | ICD-10-CM

## 2016-12-11 DIAGNOSIS — I10 Essential (primary) hypertension: Secondary | ICD-10-CM

## 2016-12-11 NOTE — Progress Notes (Signed)
Subjective:    Patient ID: Gina Levine, female    DOB: 04-26-1950, 66 y.o.   MRN: [id number]  HPI  03/2016 Patient is here today for complete physical exam. Her last mammogram was in February of last year. She is already scheduled her mammogram for this year. She is over 65 and therefore does not require a Pap smear. Her colonoscopy is up-to-date. She is due for a flu shot as well as a booster on her pneumonia vaccine. Immunization History  Administered Date(s) Administered  . Influenza,inj,Quad PF,6+ Mos 11/19/2012, 12/01/2013, 11/24/2015  . Pneumococcal Conjugate-13 04/14/2015  . Pneumococcal Polysaccharide-23 03/19/2016  . Tdap 11/19/2012   Blood pressure today is elevated but she's been rushing around and she is little bit nervous. Otherwise she is doing well with no concerns.  At that time, my plan was: Physical exam today is normal. Blood pressures elevated. Patient will have his recheck her blood pressure tomorrow when she comes in for fasting lab work including a CBC, CMP, fasting lipid panel. Immunizations were updated today. She is her a schedule her mammogram. Colonoscopy is up-to-date and she does not require a Pap smear.  Repeat blood pressure the next day was 114/70.   12/11/16 Patient is a very 66 year old African-American female who is here today to recheck her blood pressure. pleasant 66 year old African-American female who is here today to recheck her blood pressure.  Her blood pressure today is excellent.  She denies any chest pain shortness of breath or dyspnea on exertion.  She is due for a flu shot.  Her biggest concern is pain in her right heel near the insertion of the plantar fascia.  She reports a stabbing pain when she first wakes up in the morning as she starts to walk.  It gradually gets better as the day goes on.  It hurts to walk after prolonged sitting.  She denies any injuries.  She recently has been exercising more and walking up to 4 miles a day  Past Medical History:  Diagnosis Date  . Anemia   . Depression   . Hypertension   .  Nephrolithiasis   . Osteopenia 2017   T=-1.7   Past Surgical History:  Procedure Laterality Date  . LITHOTRIPSY     x 2   Current Outpatient Prescriptions on File Prior to Visit  Medication Sig Dispense Refill  . amLODipine (NORVASC) 10 MG tablet TAKE 1 TABLET EVERY DAY 90 tablet 3  . cholecalciferol (VITAMIN D) 1000 UNITS tablet Take 1,000 Units by mouth daily.    . famotidine (PEPCID) 20 MG tablet TAKE 1 TABLET BY MOUTH TWICE A DAY 60 tablet 0  . hydrochlorothiazide (HYDRODIURIL) 25 MG tablet TAKE 1 TABLET BY MOUTH EVERY DAY 90 tablet 3  . omeprazole (PRILOSEC) 20 MG capsule TAKE ONE CAPSULE EVERY DAY 90 capsule 3   No current facility-administered medications on file prior to visit.    Allergies  Allergen Reactions  . Benazepril Other (See Comments)    "makes toes swell"    Social History   Social History  . Marital status: Married    Spouse name: N/A  . Number of children: 4  . Years of education: N/A   Occupational History  . Retired    Social History Main Topics  . Smoking status: Never Smoker  . Smokeless tobacco: Never Used  . Alcohol use No  . Drug use: No  . Sexual activity: Not on file   Other Topics Concern  . Not on file   Social History Narrative  . No narrative on  file   Family History  Problem Relation Age of Onset  . Cerebrovascular Accident Unknown       Review of Systems  All other systems reviewed and are negative.      Objective:   Physical Exam  Constitutional: She appears well-developed and well-nourished. No distress.  HENT:  Head: Normocephalic and atraumatic.  Right Ear: External ear normal.  Left Ear: External ear normal.  Nose: Nose normal.  Mouth/Throat: Oropharynx is clear and moist. No oropharyngeal exudate.  Eyes: Pupils are equal, round, and reactive to light. Conjunctivae and EOM are normal. Right eye exhibits no discharge. Left eye exhibits no discharge. No scleral icterus.  Neck: Normal range of motion. Neck  supple. No JVD present. No tracheal deviation present. No thyromegaly present.  Cardiovascular: Normal rate, regular rhythm, normal heart sounds and intact distal pulses.  Exam reveals no gallop and no friction rub.   No murmur heard. Pulmonary/Chest: Effort normal and breath sounds normal. No stridor. No respiratory distress. She has no wheezes. She has no rales. She exhibits no tenderness.  Abdominal: Soft. Bowel sounds are normal. She exhibits no distension and no mass. There is no tenderness. There is no rebound and no guarding.  Musculoskeletal: Normal range of motion. She exhibits no edema or deformity.       Right foot: There is tenderness and bony tenderness. There is normal range of motion, no crepitus, no deformity and no laceration.       Feet:  Lymphadenopathy:    She has no cervical adenopathy.  Skin: She is not diaphoretic.  Vitals reviewed.         Assessment & Plan:  Benign essential HTN - Plan: CBC with Differential/Platelet, COMPLETE METABOLIC PANEL WITH GFR, Lipid panel  Her blood pressure is well controlled.  I will check a fasting lipid panel to monitor her cholesterol along with her renal function and potassium.  I encouraged the patient to receive a flu shot today.  I believe the pain in her foot is likely secondary to plantar fasciitis.  I provided the patient information on stretches to perform.  I also recommended changing her exercise pattern to avoid walking 4 miles a day which is likely exacerbating this and to try riding a stationary bike.  Recheck if symptoms are not improving over the next 2-4 weeks

## 2016-12-11 NOTE — Addendum Note (Signed)
Addended by: Shary Decamp B on: 12/11/2016 11:04 AM   Modules accepted: Orders

## 2016-12-18 LAB — CBC WITH DIFFERENTIAL/PLATELET
Basophils Absolute: 31 cells/uL (ref 0–200)
Basophils Relative: 0.5 %
Eosinophils Absolute: 415 cells/uL (ref 15–500)
Eosinophils Relative: 6.7 %
HCT: 32.4 % — ABNORMAL LOW (ref 35.0–45.0)
Hemoglobin: 10.5 g/dL — ABNORMAL LOW (ref 11.7–15.5)
Lymphs Abs: 2189 cells/uL (ref 850–3900)
MCH: 29.8 pg (ref 27.0–33.0)
MCHC: 32.4 g/dL (ref 32.0–36.0)
MCV: 92 fL (ref 80.0–100.0)
MPV: 11.9 fL (ref 7.5–12.5)
Monocytes Relative: 6.7 %
Neutro Abs: 3150 cells/uL (ref 1500–7800)
Neutrophils Relative %: 50.8 %
Platelets: 239 10*3/uL (ref 140–400)
RBC: 3.52 10*6/uL — ABNORMAL LOW (ref 3.80–5.10)
RDW: 11.4 % (ref 11.0–15.0)
Total Lymphocyte: 35.3 %
WBC mixed population: 415 cells/uL (ref 200–950)
WBC: 6.2 10*3/uL (ref 3.8–10.8)

## 2016-12-18 LAB — COMPLETE METABOLIC PANEL WITH GFR
AG Ratio: 1.7 (calc) (ref 1.0–2.5)
ALT: 11 U/L (ref 6–29)
AST: 15 U/L (ref 10–35)
Albumin: 4.6 g/dL (ref 3.6–5.1)
Alkaline phosphatase (APISO): 62 U/L (ref 33–130)
BUN/Creatinine Ratio: 20 (calc) (ref 6–22)
BUN: 23 mg/dL (ref 7–25)
CO2: 24 mmol/L (ref 20–32)
Calcium: 9.6 mg/dL (ref 8.6–10.4)
Chloride: 104 mmol/L (ref 98–110)
Creat: 1.13 mg/dL — ABNORMAL HIGH (ref 0.50–0.99)
GFR, Est African American: 59 mL/min/{1.73_m2} — ABNORMAL LOW (ref >=60)
GFR, Est Non African American: 51 mL/min/{1.73_m2} — ABNORMAL LOW (ref >=60)
Globulin: 2.7 g/dL (calc) (ref 1.9–3.7)
Glucose, Bld: 83 mg/dL (ref 65–99)
Potassium: 4.3 mmol/L (ref 3.5–5.3)
Sodium: 139 mmol/L (ref 135–146)
Total Bilirubin: 0.6 mg/dL (ref 0.2–1.2)
Total Protein: 7.3 g/dL (ref 6.1–8.1)

## 2016-12-18 LAB — LIPID PANEL
Cholesterol: 190 mg/dL (ref <200)
HDL: 59 mg/dL (ref >50)
LDL Cholesterol (Calc): 108 mg/dL (calc) — ABNORMAL HIGH
Non-HDL Cholesterol (Calc): 131 mg/dL (calc) — ABNORMAL HIGH (ref <130)
Total CHOL/HDL Ratio: 3.2 (calc) (ref <5.0)
Triglycerides: 119 mg/dL (ref <150)

## 2016-12-18 LAB — IRON, TOTAL/TOTAL IRON BINDING CAP
%SAT: 37 % (calc) (ref 11–50)
Iron: 106 ug/dL (ref 45–160)
TIBC: 289 mcg/dL (calc) (ref 250–450)

## 2016-12-18 LAB — VITAMIN B12: Vitamin B-12: 252 pg/mL (ref 200–1100)

## 2016-12-19 ENCOUNTER — Other Ambulatory Visit: Payer: Self-pay | Admitting: Family Medicine

## 2016-12-19 DIAGNOSIS — D509 Iron deficiency anemia, unspecified: Secondary | ICD-10-CM

## 2017-01-07 ENCOUNTER — Other Ambulatory Visit: Payer: Self-pay | Admitting: Family Medicine

## 2017-01-07 NOTE — Telephone Encounter (Signed)
Medication refilled per protocol. 

## 2017-01-24 ENCOUNTER — Other Ambulatory Visit: Payer: Self-pay | Admitting: Family Medicine

## 2017-03-26 ENCOUNTER — Encounter: Payer: Medicare Other | Admitting: Family Medicine

## 2017-04-04 ENCOUNTER — Encounter: Payer: Self-pay | Admitting: Family Medicine

## 2017-04-08 ENCOUNTER — Other Ambulatory Visit: Payer: Self-pay | Admitting: *Deleted

## 2017-04-08 MED ORDER — FAMOTIDINE 20 MG PO TABS: 20.0000 mg | ORAL_TABLET | Freq: Two times a day (BID) | ORAL | 3 refills | Status: DC

## 2017-04-12 ENCOUNTER — Encounter: Payer: Medicare Other | Admitting: Family Medicine

## 2017-04-23 ENCOUNTER — Encounter: Payer: Self-pay | Admitting: Family Medicine

## 2017-04-23 ENCOUNTER — Ambulatory Visit (INDEPENDENT_AMBULATORY_CARE_PROVIDER_SITE_OTHER): Payer: Medicare Other | Admitting: Family Medicine

## 2017-04-23 VITALS — BP 118/74 | HR 70 | Temp 97.9°F | Resp 14 | Ht 62.0 in | Wt 164.0 lb

## 2017-04-23 DIAGNOSIS — I10 Essential (primary) hypertension: Secondary | ICD-10-CM

## 2017-04-23 DIAGNOSIS — Z1159 Encounter for screening for other viral diseases: Secondary | ICD-10-CM | POA: Diagnosis not present

## 2017-04-23 DIAGNOSIS — Z0000 Encounter for general adult medical examination without abnormal findings: Secondary | ICD-10-CM

## 2017-04-23 NOTE — Progress Notes (Signed)
Subjective:    Patient ID: Gina Levine, female    DOB: 1950/05/22, 67 y.o.   MRN: [id number]  HPI Patient is here today for complete physical exam. Her last mammogram was earlier this year.  She is over 65 and therefore does not require a Pap smear. Her colonoscopy is up-to-date. She is due for Shingrix.  . Immunization History  Administered Date(s) Administered  . Influenza,inj,Quad PF,6+ Mos 11/19/2012, 12/01/2013, 11/24/2015, 12/11/2016  . Pneumococcal Conjugate-13 04/14/2015  . Pneumococcal Polysaccharide-23 03/19/2016  . Tdap 11/19/2012    Past Medical History:  Diagnosis Date  . Anemia   . Depression   . Hypertension   . Nephrolithiasis   . Osteopenia 2017   T=-1.7   Past Surgical History:  Procedure Laterality Date  . LITHOTRIPSY     x 2   Current Outpatient Medications on File Prior to Visit  Medication Sig Dispense Refill  . amLODipine (NORVASC) 10 MG tablet TAKE 1 TABLET EVERY DAY 90 tablet 3  . cholecalciferol (VITAMIN D) 1000 UNITS tablet Take 1,000 Units by mouth daily.    . famotidine (PEPCID) 20 MG tablet Take 1 tablet (20 mg total) by mouth 2 (two) times daily. 180 tablet 3  . ferrous sulfate 325 (65 FE) MG EC tablet Take 325 mg by mouth daily with breakfast.    . hydrochlorothiazide (HYDRODIURIL) 25 MG tablet TAKE 1 TABLET BY MOUTH EVERY DAY 90 tablet 3  . omeprazole (PRILOSEC) 20 MG capsule TAKE ONE CAPSULE EVERY DAY 90 capsule 3   No current facility-administered medications on file prior to visit.    Allergies  Allergen Reactions  . Benazepril Other (See Comments)    "makes toes swell"    Social History   Socioeconomic History  . Marital status: Married    Spouse name: Not on file  . Number of children: 4  . Years of education: Not on file  . Highest education level: Not on file  Social Needs  . Financial resource strain: Not on file  . Food insecurity - worry: Not on file  . Food insecurity - inability: Not on file  .  Transportation needs - medical: Not on file  . Transportation needs - non-medical: Not on file  Occupational History  . Occupation: Retired  Tobacco Use  . Smoking status: Never Smoker  . Smokeless tobacco: Never Used  Substance and Sexual Activity  . Alcohol use: No  . Drug use: No  . Sexual activity: Not on file  Other Topics Concern  . Not on file  Social History Narrative  . Not on file   Family History  Problem Relation Age of Onset  . Cerebrovascular Accident Unknown       Review of Systems  All other systems reviewed and are negative.      Objective:   Physical Exam  Constitutional: She is oriented to person, place, and time. She appears well-developed and well-nourished. No distress.  HENT:  Head: Normocephalic and atraumatic.  Right Ear: External ear normal.  Left Ear: External ear normal.  Nose: Nose normal.  Mouth/Throat: Oropharynx is clear and moist. No oropharyngeal exudate.  Eyes: Conjunctivae and EOM are normal. Pupils are equal, round, and reactive to light. Right eye exhibits no discharge. Left eye exhibits no discharge. No scleral icterus.  Neck: Normal range of motion. Neck supple. No JVD present. No tracheal deviation present. No thyromegaly present.  Cardiovascular: Normal rate, regular rhythm, normal heart sounds and intact distal pulses. Exam reveals no  gallop and no friction rub.  No murmur heard. Pulmonary/Chest: Effort normal and breath sounds normal. No stridor. No respiratory distress. She has no wheezes. She has no rales. She exhibits no tenderness.  Abdominal: Soft. Bowel sounds are normal. She exhibits no distension and no mass. There is no tenderness. There is no rebound and no guarding.  Musculoskeletal: Normal range of motion. She exhibits no edema, tenderness or deformity.  Lymphadenopathy:    She has no cervical adenopathy.  Neurological: She is alert and oriented to person, place, and time. She has normal reflexes. No cranial nerve  deficit. She exhibits normal muscle tone. Coordination normal.  Skin: Skin is warm. No rash noted. She is not diaphoretic. No erythema. No pallor.  Psychiatric: She has a normal mood and affect. Her behavior is normal. Judgment and thought content normal.  Vitals reviewed.         Assessment & Plan:  Benign essential HTN - Plan: CBC with Differential/Platelet, COMPLETE METABOLIC PANEL WITH GFR, Lipid panel  Encounter for hepatitis C screening test for low risk patient - Plan: Hepatitis C Antibody  General medical exam  CPE is normal. BP is excellent.  Recommended shingrix.  Other immunizations are UTD.  Cancer screening is UTD.  Colonosocpy is next due in 2024.  Obtain cbc, cmp, flp, and hep c screen.

## 2017-04-24 LAB — CBC WITH DIFFERENTIAL/PLATELET
Basophils Absolute: 61 cells/uL (ref 0–200)
Basophils Relative: 0.9 %
Eosinophils Absolute: 299 cells/uL (ref 15–500)
Eosinophils Relative: 4.4 %
HCT: 35.1 % (ref 35.0–45.0)
Hemoglobin: 11.4 g/dL — ABNORMAL LOW (ref 11.7–15.5)
Lymphs Abs: 2332 cells/uL (ref 850–3900)
MCH: 29.9 pg (ref 27.0–33.0)
MCHC: 32.5 g/dL (ref 32.0–36.0)
MCV: 92.1 fL (ref 80.0–100.0)
MPV: 11.8 fL (ref 7.5–12.5)
Monocytes Relative: 7.1 %
Neutro Abs: 3624 cells/uL (ref 1500–7800)
Neutrophils Relative %: 53.3 %
Platelets: 277 10*3/uL (ref 140–400)
RBC: 3.81 10*6/uL (ref 3.80–5.10)
RDW: 11.5 % (ref 11.0–15.0)
Total Lymphocyte: 34.3 %
WBC mixed population: 483 cells/uL (ref 200–950)
WBC: 6.8 10*3/uL (ref 3.8–10.8)

## 2017-04-24 LAB — COMPLETE METABOLIC PANEL WITH GFR
AG Ratio: 1.6 (calc) (ref 1.0–2.5)
ALT: 13 U/L (ref 6–29)
AST: 17 U/L (ref 10–35)
Albumin: 4.6 g/dL (ref 3.6–5.1)
Alkaline phosphatase (APISO): 69 U/L (ref 33–130)
BUN/Creatinine Ratio: 18 (calc) (ref 6–22)
BUN: 24 mg/dL (ref 7–25)
CO2: 26 mmol/L (ref 20–32)
Calcium: 10 mg/dL (ref 8.6–10.4)
Chloride: 103 mmol/L (ref 98–110)
Creat: 1.34 mg/dL — ABNORMAL HIGH (ref 0.50–0.99)
GFR, Est African American: 47 mL/min/{1.73_m2} — ABNORMAL LOW (ref >=60)
GFR, Est Non African American: 41 mL/min/{1.73_m2} — ABNORMAL LOW (ref >=60)
Globulin: 2.9 g/dL (calc) (ref 1.9–3.7)
Glucose, Bld: 89 mg/dL (ref 65–99)
Potassium: 4.1 mmol/L (ref 3.5–5.3)
Sodium: 140 mmol/L (ref 135–146)
Total Bilirubin: 0.7 mg/dL (ref 0.2–1.2)
Total Protein: 7.5 g/dL (ref 6.1–8.1)

## 2017-04-24 LAB — LIPID PANEL
Cholesterol: 197 mg/dL (ref <200)
HDL: 62 mg/dL (ref >50)
LDL Cholesterol (Calc): 118 mg/dL (calc) — ABNORMAL HIGH
Non-HDL Cholesterol (Calc): 135 mg/dL (calc) — ABNORMAL HIGH (ref <130)
Total CHOL/HDL Ratio: 3.2 (calc) (ref <5.0)
Triglycerides: 76 mg/dL (ref <150)

## 2017-04-24 LAB — HEPATITIS C ANTIBODY
Hepatitis C Ab: NONREACTIVE
SIGNAL TO CUT-OFF: 0.01 (ref <1.00)

## 2017-04-26 ENCOUNTER — Encounter: Payer: Self-pay | Admitting: Family Medicine

## 2017-05-16 ENCOUNTER — Other Ambulatory Visit: Payer: Self-pay | Admitting: Family Medicine

## 2017-08-19 ENCOUNTER — Other Ambulatory Visit: Payer: Self-pay | Admitting: Family Medicine

## 2017-11-29 ENCOUNTER — Ambulatory Visit (INDEPENDENT_AMBULATORY_CARE_PROVIDER_SITE_OTHER): Payer: Medicare Other

## 2017-11-29 DIAGNOSIS — Z23 Encounter for immunization: Secondary | ICD-10-CM | POA: Diagnosis not present

## 2017-11-29 NOTE — Progress Notes (Signed)
Patient came in today to receive annual flu vaccine. She was given the fluarix in the left deltoid. She tolerated well. VIS given

## 2018-01-29 ENCOUNTER — Other Ambulatory Visit: Payer: Self-pay | Admitting: Family Medicine

## 2018-05-02 ENCOUNTER — Encounter: Payer: Medicare Other | Admitting: Family Medicine

## 2018-05-19 ENCOUNTER — Other Ambulatory Visit: Payer: Self-pay

## 2018-05-19 MED ORDER — FAMOTIDINE 20 MG PO TABS: 20.0000 mg | ORAL_TABLET | Freq: Two times a day (BID) | ORAL | 0 refills | Status: DC

## 2018-05-28 ENCOUNTER — Other Ambulatory Visit: Payer: Self-pay | Admitting: Family Medicine

## 2018-08-19 ENCOUNTER — Other Ambulatory Visit: Payer: Self-pay | Admitting: Family Medicine

## 2018-09-17 ENCOUNTER — Other Ambulatory Visit: Payer: Self-pay

## 2018-09-17 ENCOUNTER — Other Ambulatory Visit: Payer: Medicare Other

## 2018-09-17 DIAGNOSIS — E559 Vitamin D deficiency, unspecified: Secondary | ICD-10-CM

## 2018-09-17 DIAGNOSIS — Z0000 Encounter for general adult medical examination without abnormal findings: Secondary | ICD-10-CM

## 2018-09-17 DIAGNOSIS — I10 Essential (primary) hypertension: Secondary | ICD-10-CM

## 2018-09-17 DIAGNOSIS — Z79899 Other long term (current) drug therapy: Secondary | ICD-10-CM

## 2018-09-18 ENCOUNTER — Other Ambulatory Visit: Payer: Self-pay

## 2018-09-18 LAB — COMPREHENSIVE METABOLIC PANEL
AG Ratio: 1.6 (calc) (ref 1.0–2.5)
ALT: 11 U/L (ref 6–29)
AST: 14 U/L (ref 10–35)
Albumin: 4.3 g/dL (ref 3.6–5.1)
Alkaline phosphatase (APISO): 68 U/L (ref 37–153)
BUN/Creatinine Ratio: 16 (calc) (ref 6–22)
BUN: 21 mg/dL (ref 7–25)
CO2: 26 mmol/L (ref 20–32)
Calcium: 9.8 mg/dL (ref 8.6–10.4)
Chloride: 107 mmol/L (ref 98–110)
Creat: 1.33 mg/dL — ABNORMAL HIGH (ref 0.50–0.99)
Globulin: 2.7 g/dL (calc) (ref 1.9–3.7)
Glucose, Bld: 93 mg/dL (ref 65–99)
Potassium: 5 mmol/L (ref 3.5–5.3)
Sodium: 142 mmol/L (ref 135–146)
Total Bilirubin: 0.4 mg/dL (ref 0.2–1.2)
Total Protein: 7 g/dL (ref 6.1–8.1)

## 2018-09-18 LAB — LIPID PANEL
Cholesterol: 188 mg/dL (ref <200)
HDL: 47 mg/dL — ABNORMAL LOW (ref >=50)
LDL Cholesterol (Calc): 120 mg/dL (calc) — ABNORMAL HIGH
Non-HDL Cholesterol (Calc): 141 mg/dL (calc) — ABNORMAL HIGH (ref <130)
Total CHOL/HDL Ratio: 4 (calc) (ref <5.0)
Triglycerides: 107 mg/dL (ref <150)

## 2018-09-18 LAB — CBC WITH DIFFERENTIAL/PLATELET
Absolute Monocytes: 341 cells/uL (ref 200–950)
Basophils Absolute: 50 cells/uL (ref 0–200)
Basophils Relative: 0.8 %
Eosinophils Absolute: 471 cells/uL (ref 15–500)
Eosinophils Relative: 7.6 %
HCT: 33.9 % — ABNORMAL LOW (ref 35.0–45.0)
Hemoglobin: 10.6 g/dL — ABNORMAL LOW (ref 11.7–15.5)
Lymphs Abs: 1922 cells/uL (ref 850–3900)
MCH: 29.9 pg (ref 27.0–33.0)
MCHC: 31.3 g/dL — ABNORMAL LOW (ref 32.0–36.0)
MCV: 95.5 fL (ref 80.0–100.0)
MPV: 12.2 fL (ref 7.5–12.5)
Monocytes Relative: 5.5 %
Neutro Abs: 3416 cells/uL (ref 1500–7800)
Neutrophils Relative %: 55.1 %
Platelets: 253 10*3/uL (ref 140–400)
RBC: 3.55 10*6/uL — ABNORMAL LOW (ref 3.80–5.10)
RDW: 11.6 % (ref 11.0–15.0)
Total Lymphocyte: 31 %
WBC: 6.2 10*3/uL (ref 3.8–10.8)

## 2018-09-18 LAB — VITAMIN D 25 HYDROXY (VIT D DEFICIENCY, FRACTURES): Vit D, 25-Hydroxy: 19 ng/mL — ABNORMAL LOW (ref 30–100)

## 2018-09-18 LAB — TSH: TSH: 0.93 mIU/L (ref 0.40–4.50)

## 2018-09-19 ENCOUNTER — Encounter: Payer: Self-pay | Admitting: Family Medicine

## 2018-09-19 ENCOUNTER — Ambulatory Visit (INDEPENDENT_AMBULATORY_CARE_PROVIDER_SITE_OTHER): Payer: Medicare Other | Admitting: Family Medicine

## 2018-09-19 VITALS — BP 128/74 | HR 76 | Temp 98.7°F | Resp 18 | Ht 62.0 in | Wt 173.0 lb

## 2018-09-19 DIAGNOSIS — Z0001 Encounter for general adult medical examination with abnormal findings: Secondary | ICD-10-CM | POA: Diagnosis not present

## 2018-09-19 DIAGNOSIS — Z1239 Encounter for other screening for malignant neoplasm of breast: Secondary | ICD-10-CM

## 2018-09-19 DIAGNOSIS — Z0000 Encounter for general adult medical examination without abnormal findings: Secondary | ICD-10-CM

## 2018-09-19 DIAGNOSIS — I10 Essential (primary) hypertension: Secondary | ICD-10-CM | POA: Diagnosis not present

## 2018-09-19 DIAGNOSIS — M8580 Other specified disorders of bone density and structure, unspecified site: Secondary | ICD-10-CM | POA: Diagnosis not present

## 2018-09-19 NOTE — Progress Notes (Signed)
Subjective:    Patient ID: Gina Levine, female    DOB: [birth year], 68 y.o.   MRN: [id number]  HPI  Patient is here today for complete physical exam. Her last mammogram was 03/2017.  This is the last mammogram that I can see in epic.  However patient states that she had a mammogram performed in February of this year.  Therefore I have asked the patient to sign a release of information form so that we can get this mammogram result.  She is over 65 and therefore does not require a Pap smear. Her colonoscopy was performed October 23, 2012.  One polyp was biopsied but it was not adenomatous.  Last bone density test was performed in 2017.  It showed osteopenia with a T score of -1.7.  She is due for repeat colonoscopy in 2024. She is due for Shingrix.  . Immunization History  Administered Date(s) Administered  . Influenza,inj,Quad PF,6+ Mos 11/19/2012, 12/01/2013, 11/24/2015, 12/11/2016, 11/29/2017  . Pneumococcal Conjugate-13 04/14/2015  . Pneumococcal Polysaccharide-23 03/19/2016  . Tdap 11/19/2012    Past Medical History:  Diagnosis Date  . Anemia   . Depression   . Hypertension   . Nephrolithiasis   . Osteopenia 2017   T=-1.7   Past Surgical History:  Procedure Laterality Date  . LITHOTRIPSY     x 2   Current Outpatient Medications on File Prior to Visit  Medication Sig Dispense Refill  . amLODipine (NORVASC) 10 MG tablet TAKE 1 TABLET EVERY DAY 90 tablet 3  . cholecalciferol (VITAMIN D) 1000 UNITS tablet Take 1,000 Units by mouth daily.    . famotidine (PEPCID) 20 MG tablet TAKE 1 TABLET BY MOUTH TWICE A DAY 180 tablet 3  . ferrous sulfate 325 (65 FE) MG EC tablet Take 325 mg by mouth daily with breakfast.    . hydrochlorothiazide (HYDRODIURIL) 25 MG tablet TAKE 1 TABLET BY MOUTH EVERY DAY 90 tablet 3  . omeprazole (PRILOSEC) 20 MG capsule TAKE ONE CAPSULE EVERY DAY 90 capsule 3   No current facility-administered medications on file prior to visit.    Allergies   Allergen Reactions  . Benazepril Other (See Comments)    "makes toes swell"    Social History   Socioeconomic History  . Marital status: Married    Spouse name: Not on file  . Number of children: 4  . Years of education: Not on file  . Highest education level: Not on file  Occupational History  . Occupation: Retired  Scientific laboratory technician  . Financial resource strain: Not on file  . Food insecurity    Worry: Not on file    Inability: Not on file  . Transportation needs    Medical: Not on file    Non-medical: Not on file  Tobacco Use  . Smoking status: Never Smoker  . Smokeless tobacco: Never Used  Substance and Sexual Activity  . Alcohol use: No  . Drug use: No  . Sexual activity: Not on file  Lifestyle  . Physical activity    Days per week: Not on file    Minutes per session: Not on file  . Stress: Not on file  Relationships  . Social Herbalist on phone: Not on file    Gets together: Not on file    Attends religious service: Not on file    Active member of club or organization: Not on file    Attends meetings of clubs or organizations: Not on file  Relationship status: Not on file  . Intimate partner violence    Fear of current or ex partner: Not on file    Emotionally abused: Not on file    Physically abused: Not on file    Forced sexual activity: Not on file  Other Topics Concern  . Not on file  Social History Narrative  . Not on file   Family History  Problem Relation Age of Onset  . Cerebrovascular Accident Unknown       Review of Systems  All other systems reviewed and are negative.      Objective:   Physical Exam Vitals signs reviewed.  Constitutional:      General: She is not in acute distress.    Appearance: She is well-developed. She is not diaphoretic.  HENT:     Head: Normocephalic and atraumatic.     Right Ear: External ear normal.     Left Ear: External ear normal.     Nose: Nose normal.     Mouth/Throat:     Pharynx: No  oropharyngeal exudate.  Eyes:     General: No scleral icterus.       Right eye: No discharge.        Left eye: No discharge.     Conjunctiva/sclera: Conjunctivae normal.     Pupils: Pupils are equal, round, and reactive to light.  Neck:     Musculoskeletal: Normal range of motion and neck supple.     Thyroid: No thyromegaly.     Vascular: No JVD.     Trachea: No tracheal deviation.  Cardiovascular:     Rate and Rhythm: Normal rate and regular rhythm.     Heart sounds: Normal heart sounds. No murmur. No friction rub. No gallop.   Pulmonary:     Effort: Pulmonary effort is normal. No respiratory distress.     Breath sounds: Normal breath sounds. No stridor. No wheezing or rales.  Chest:     Chest wall: No tenderness.  Abdominal:     General: Bowel sounds are normal. There is no distension.     Palpations: Abdomen is soft. There is no mass.     Tenderness: There is no abdominal tenderness. There is no guarding or rebound.  Musculoskeletal: Normal range of motion.        General: No tenderness or deformity.  Lymphadenopathy:     Cervical: No cervical adenopathy.  Skin:    General: Skin is warm.     Coloration: Skin is not pale.     Findings: No erythema or rash.  Neurological:     Mental Status: She is alert and oriented to person, place, and time.     Cranial Nerves: No cranial nerve deficit.     Motor: No abnormal muscle tone.     Coordination: Coordination normal.     Deep Tendon Reflexes: Reflexes are normal and symmetric.  Psychiatric:        Behavior: Behavior normal.        Thought Content: Thought content normal.        Judgment: Judgment normal.           Assessment & Plan:  1. General medical exam Colonoscopy is up-to-date.  Patient will give [place] a release of information form so that we can get the results of her mammogram.  If she is mistaken I will schedule her for mammogram.  Patient does not require Pap smear.  She denies any issues with memory loss, falls,  or depression  2. Benign essential HTN Blood pressure is well controlled today.  Reviewed fasting lab work with the patient which did show mild chronic kidney disease.  Recommended avoidance of NSAIDs and drinking plenty of water.  She also has had a mild elevation in her LDL cholesterol and a drop in her HDL cholesterol however she has stopped walking.  She was walking vigorously every day last year.  I recommended that she resume her exercise and then recheck lab work after 6 months. - CBC with Differential/Platelet - COMPLETE METABOLIC PANEL WITH GFR - Lipid panel  3. Osteopenia, unspecified location Schedule the patient for a bone density test.  Recommended 1200 mg a day of calcium and 1000 units a day of vitamin D - DG Bone Density; Future  4. Screening for breast cancer We will get a copy of the patient's most recent mammogram.  If it is been more than 1 year we will schedule the patient for mammogram but she is convinced that she had one earlier this year I just cannot find records of this. - MM Digital Screening; Future

## 2018-10-27 ENCOUNTER — Other Ambulatory Visit: Payer: Self-pay

## 2018-10-27 ENCOUNTER — Ambulatory Visit (HOSPITAL_COMMUNITY)
Admission: RE | Admit: 2018-10-27 | Discharge: 2018-10-27 | Disposition: A | Discharge status: Home / Self Care | Payer: Medicare Other | Source: Ambulatory Visit | Attending: Family Medicine | Admitting: Family Medicine

## 2018-10-27 DIAGNOSIS — Z780 Asymptomatic menopausal state: Secondary | ICD-10-CM | POA: Insufficient documentation

## 2018-10-27 DIAGNOSIS — Z13820 Encounter for screening for osteoporosis: Secondary | ICD-10-CM | POA: Diagnosis present

## 2018-10-27 DIAGNOSIS — E559 Vitamin D deficiency, unspecified: Secondary | ICD-10-CM | POA: Diagnosis not present

## 2018-10-27 DIAGNOSIS — M8580 Other specified disorders of bone density and structure, unspecified site: Secondary | ICD-10-CM

## 2018-10-28 ENCOUNTER — Encounter: Payer: Self-pay | Admitting: Family Medicine

## 2018-11-05 ENCOUNTER — Ambulatory Visit (INDEPENDENT_AMBULATORY_CARE_PROVIDER_SITE_OTHER): Payer: Medicare Other | Admitting: Family Medicine

## 2018-11-05 ENCOUNTER — Other Ambulatory Visit: Payer: Self-pay

## 2018-11-05 DIAGNOSIS — Z23 Encounter for immunization: Secondary | ICD-10-CM | POA: Diagnosis not present

## 2019-02-04 ENCOUNTER — Other Ambulatory Visit: Payer: Self-pay | Admitting: Family Medicine

## 2019-05-22 ENCOUNTER — Other Ambulatory Visit: Payer: Self-pay | Admitting: Family Medicine

## 2019-09-18 ENCOUNTER — Other Ambulatory Visit: Payer: Medicare Other

## 2019-09-18 DIAGNOSIS — I10 Essential (primary) hypertension: Secondary | ICD-10-CM

## 2019-09-18 DIAGNOSIS — Z13220 Encounter for screening for lipoid disorders: Secondary | ICD-10-CM

## 2019-09-18 DIAGNOSIS — Z0000 Encounter for general adult medical examination without abnormal findings: Secondary | ICD-10-CM

## 2019-09-18 DIAGNOSIS — E559 Vitamin D deficiency, unspecified: Secondary | ICD-10-CM

## 2019-09-19 LAB — COMPLETE METABOLIC PANEL WITH GFR
AG Ratio: 1.6 (calc) (ref 1.0–2.5)
ALT: 9 U/L (ref 6–29)
AST: 14 U/L (ref 10–35)
Albumin: 4.4 g/dL (ref 3.6–5.1)
Alkaline phosphatase (APISO): 67 U/L (ref 37–153)
BUN/Creatinine Ratio: 13 (calc) (ref 6–22)
BUN: 16 mg/dL (ref 7–25)
CO2: 24 mmol/L (ref 20–32)
Calcium: 9.7 mg/dL (ref 8.6–10.4)
Chloride: 107 mmol/L (ref 98–110)
Creat: 1.26 mg/dL — ABNORMAL HIGH (ref 0.50–0.99)
GFR, Est African American: 50 mL/min/{1.73_m2} — ABNORMAL LOW (ref >=60)
GFR, Est Non African American: 43 mL/min/{1.73_m2} — ABNORMAL LOW (ref >=60)
Globulin: 2.8 g/dL (calc) (ref 1.9–3.7)
Glucose, Bld: 86 mg/dL (ref 65–99)
Potassium: 4.7 mmol/L (ref 3.5–5.3)
Sodium: 142 mmol/L (ref 135–146)
Total Bilirubin: 0.7 mg/dL (ref 0.2–1.2)
Total Protein: 7.2 g/dL (ref 6.1–8.1)

## 2019-09-19 LAB — VITAMIN D 25 HYDROXY (VIT D DEFICIENCY, FRACTURES): Vit D, 25-Hydroxy: 19 ng/mL — ABNORMAL LOW (ref 30–100)

## 2019-09-19 LAB — CBC WITH DIFFERENTIAL/PLATELET
Absolute Monocytes: 445 cells/uL (ref 200–950)
Basophils Absolute: 44 cells/uL (ref 0–200)
Basophils Relative: 0.6 %
Eosinophils Absolute: 577 cells/uL — ABNORMAL HIGH (ref 15–500)
Eosinophils Relative: 7.9 %
HCT: 34.1 % — ABNORMAL LOW (ref 35.0–45.0)
Hemoglobin: 10.8 g/dL — ABNORMAL LOW (ref 11.7–15.5)
Lymphs Abs: 1993 cells/uL (ref 850–3900)
MCH: 29.3 pg (ref 27.0–33.0)
MCHC: 31.7 g/dL — ABNORMAL LOW (ref 32.0–36.0)
MCV: 92.4 fL (ref 80.0–100.0)
MPV: 12.4 fL (ref 7.5–12.5)
Monocytes Relative: 6.1 %
Neutro Abs: 4241 cells/uL (ref 1500–7800)
Neutrophils Relative %: 58.1 %
Platelets: 225 10*3/uL (ref 140–400)
RBC: 3.69 10*6/uL — ABNORMAL LOW (ref 3.80–5.10)
RDW: 11.8 % (ref 11.0–15.0)
Total Lymphocyte: 27.3 %
WBC: 7.3 10*3/uL (ref 3.8–10.8)

## 2019-09-19 LAB — LIPID PANEL
Cholesterol: 190 mg/dL (ref <200)
HDL: 47 mg/dL — ABNORMAL LOW (ref >=50)
LDL Cholesterol (Calc): 117 mg/dL (calc) — ABNORMAL HIGH
Non-HDL Cholesterol (Calc): 143 mg/dL (calc) — ABNORMAL HIGH (ref <130)
Total CHOL/HDL Ratio: 4 (calc) (ref <5.0)
Triglycerides: 145 mg/dL (ref <150)

## 2019-09-22 ENCOUNTER — Encounter: Payer: Self-pay | Admitting: Family Medicine

## 2019-09-22 ENCOUNTER — Ambulatory Visit (INDEPENDENT_AMBULATORY_CARE_PROVIDER_SITE_OTHER): Payer: Medicare Other | Admitting: Family Medicine

## 2019-09-22 ENCOUNTER — Other Ambulatory Visit: Payer: Self-pay

## 2019-09-22 VITALS — BP 120/84 | HR 83 | Temp 97.0°F | Ht 62.0 in | Wt 183.0 lb

## 2019-09-22 DIAGNOSIS — Z0001 Encounter for general adult medical examination with abnormal findings: Secondary | ICD-10-CM | POA: Diagnosis not present

## 2019-09-22 DIAGNOSIS — M8580 Other specified disorders of bone density and structure, unspecified site: Secondary | ICD-10-CM

## 2019-09-22 DIAGNOSIS — D649 Anemia, unspecified: Secondary | ICD-10-CM | POA: Diagnosis not present

## 2019-09-22 DIAGNOSIS — Z0000 Encounter for general adult medical examination without abnormal findings: Secondary | ICD-10-CM

## 2019-09-22 DIAGNOSIS — I10 Essential (primary) hypertension: Secondary | ICD-10-CM

## 2019-09-22 DIAGNOSIS — N183 Chronic kidney disease, stage 3 unspecified: Secondary | ICD-10-CM

## 2019-09-22 NOTE — Progress Notes (Signed)
Subjective:    Patient ID: Gina Levine, female    DOB: 1950/04/09, 69 y.o.   MRN: [id number]  HPI Patient is a very sweet 69 year old African-American female here today for a complete physical exam.  Her last colonoscopy was in 2014.  They found 1 benign polyp.  She is not due for repeat colonoscopy until 2024.  I do not have a record of her last mammogram from this year but she insists that she had one in 2021.  She is going to sign a release of information form for [place] to receive this.  She has not had a Pap smear in the last 3 years however she is over the age of 35 and therefore she does not require a Pap smear.  Her bone density test was performed last year and did show osteopenia.  She is taking vitamin D.  She is not taking calcium due to a history of recurrent kidney stones.  Her most recent immunizations are listed below.  She is due for the shingles vaccine.  She denies any falls, depression, or memory loss.. Immunization History  Administered Date(s) Administered  . Fluad Quad(high Dose 65+) 11/05/2018  . Influenza,inj,Quad PF,6+ Mos 11/19/2012, 12/01/2013, 11/24/2015, 12/11/2016, 11/29/2017  . Moderna SARS-COVID-2 Vaccination 03/18/2019, 04/15/2019  . Pneumococcal Conjugate-13 04/14/2015  . Pneumococcal Polysaccharide-23 03/19/2016  . Tdap 11/19/2012    Past Medical History:  Diagnosis Date  . Anemia   . Depression   . Hypertension   . Nephrolithiasis   . Osteopenia 2017   T=-1.7   Past Surgical History:  Procedure Laterality Date  . LITHOTRIPSY     x 2   Current Outpatient Medications on File Prior to Visit  Medication Sig Dispense Refill  . amLODipine (NORVASC) 10 MG tablet TAKE 1 TABLET BY MOUTH EVERY DAY 90 tablet 3  . cholecalciferol (VITAMIN D) 1000 UNITS tablet Take 1,000 Units by mouth daily.    . famotidine (PEPCID) 20 MG tablet TAKE 1 TABLET BY MOUTH TWICE A DAY 180 tablet 3  . ferrous sulfate 325 (65 FE) MG EC tablet Take 325 mg by mouth daily with  breakfast.    . hydrochlorothiazide (HYDRODIURIL) 25 MG tablet TAKE 1 TABLET BY MOUTH EVERY DAY 90 tablet 3  . omeprazole (PRILOSEC) 20 MG capsule TAKE 1 CAPSULE BY MOUTH EVERY DAY 90 capsule 3   No current facility-administered medications on file prior to visit.   Allergies  Allergen Reactions  . Benazepril Other (See Comments)    "makes toes swell"    Social History   Socioeconomic History  . Marital status: Married    Spouse name: Not on file  . Number of children: 4  . Years of education: Not on file  . Highest education level: Not on file  Occupational History  . Occupation: Retired  Tobacco Use  . Smoking status: Never Smoker  . Smokeless tobacco: Never Used  Substance and Sexual Activity  . Alcohol use: No  . Drug use: No  . Sexual activity: Not on file  Other Topics Concern  . Not on file  Social History Narrative  . Not on file   Social Determinants of Health   Financial Resource Strain:   . Difficulty of Paying Living Expenses:   Food Insecurity:   . Worried About Charity fundraiser in the Last Year:   . Arboriculturist in the Last Year:   Transportation Needs:   . Film/video editor (Medical):   Marland Kitchen  Lack of Transportation (Non-Medical):   Physical Activity:   . Days of Exercise per Week:   . Minutes of Exercise per Session:   Stress:   . Feeling of Stress :   Social Connections:   . Frequency of Communication with Friends and Family:   . Frequency of Social Gatherings with Friends and Family:   . Attends Religious Services:   . Active Member of Clubs or Organizations:   . Attends Archivist Meetings:   Marland Kitchen Marital Status:   Intimate Partner Violence:   . Fear of Current or Ex-Partner:   . Emotionally Abused:   Marland Kitchen Physically Abused:   . Sexually Abused:    Family History  Problem Relation Age of Onset  . Cerebrovascular Accident Other       Review of Systems  All other systems reviewed and are negative.      Objective:    Physical Exam Vitals reviewed.  Constitutional:      General: She is not in acute distress.    Appearance: She is well-developed. She is not diaphoretic.  HENT:     Head: Normocephalic and atraumatic.     Right Ear: External ear normal.     Left Ear: External ear normal.     Nose: Nose normal.     Mouth/Throat:     Pharynx: No oropharyngeal exudate.  Eyes:     General: No scleral icterus.       Right eye: No discharge.        Left eye: No discharge.     Conjunctiva/sclera: Conjunctivae normal.     Pupils: Pupils are equal, round, and reactive to light.  Neck:     Thyroid: No thyromegaly.     Vascular: No JVD.     Trachea: No tracheal deviation.  Cardiovascular:     Rate and Rhythm: Normal rate and regular rhythm.     Heart sounds: Normal heart sounds. No murmur heard.  No friction rub. No gallop.   Pulmonary:     Effort: Pulmonary effort is normal. No respiratory distress.     Breath sounds: Normal breath sounds. No stridor. No wheezing or rales.  Chest:     Chest wall: No tenderness.  Abdominal:     General: Bowel sounds are normal. There is no distension.     Palpations: Abdomen is soft. There is no mass.     Tenderness: There is no abdominal tenderness. There is no guarding or rebound.  Musculoskeletal:        General: No tenderness or deformity. Normal range of motion.     Cervical back: Normal range of motion and neck supple.  Lymphadenopathy:     Cervical: No cervical adenopathy.  Skin:    General: Skin is warm.     Coloration: Skin is not pale.     Findings: No erythema or rash.  Neurological:     Mental Status: She is alert and oriented to person, place, and time.     Cranial Nerves: No cranial nerve deficit.     Motor: No abnormal muscle tone.     Coordination: Coordination normal.     Deep Tendon Reflexes: Reflexes are normal and symmetric.  Psychiatric:        Behavior: Behavior normal.        Thought Content: Thought content normal.        Judgment:  Judgment normal.           Assessment & Plan:  Anemia, unspecified type -  Plan: Fecal Globin By Immunochemistry  General medical exam  Benign essential HTN  Osteopenia, unspecified location  Stage 3 chronic kidney disease, unspecified whether stage 3a or 3b CKD   Physical exam today is excellent.  I did encourage regular aerobic exercise in addition to weight loss.  I want the patient to take vitamin D 1000 units a day.  Patient prefers not to take calcium due to her history of recurrent kidney stones.  I recommended the shingles vaccine.  Colonoscopy Pap smear and mammogram are up-to-date however she will sign a release of information form so I can get her mammogram.  She is not due for repeat bone density for 2 more years.  She denies any falls, depression, or memory loss.  She does have some mild anemia and therefore I recommended that we check her stool for blood.  However this is a chronic problem for her in addition to her chronic kidney disease.  I did encourage the patient to avoid all NSAIDs.  She can take Tylenol for aches and pains.  I did also encourage her to drink plenty of water.

## 2019-11-25 ENCOUNTER — Other Ambulatory Visit: Payer: Self-pay

## 2019-11-25 ENCOUNTER — Ambulatory Visit (INDEPENDENT_AMBULATORY_CARE_PROVIDER_SITE_OTHER): Payer: Medicare Other

## 2019-11-25 DIAGNOSIS — Z23 Encounter for immunization: Secondary | ICD-10-CM | POA: Diagnosis not present

## 2019-12-18 ENCOUNTER — Other Ambulatory Visit: Payer: Self-pay

## 2020-02-24 ENCOUNTER — Other Ambulatory Visit: Payer: Self-pay | Admitting: Family Medicine

## 2020-05-20 ENCOUNTER — Other Ambulatory Visit: Payer: Self-pay | Admitting: *Deleted

## 2020-05-20 MED ORDER — OMEPRAZOLE 20 MG PO CPDR: DELAYED_RELEASE_CAPSULE | ORAL | 0 refills | Status: DC

## 2020-06-01 ENCOUNTER — Other Ambulatory Visit: Payer: Self-pay

## 2020-06-01 ENCOUNTER — Encounter: Payer: Self-pay | Admitting: Family Medicine

## 2020-06-01 ENCOUNTER — Ambulatory Visit: Payer: Medicare Other | Admitting: Family Medicine

## 2020-06-01 VITALS — BP 122/64 | HR 82 | Temp 98.0°F | Resp 14 | Ht 62.0 in | Wt 183.0 lb

## 2020-06-01 DIAGNOSIS — I10 Essential (primary) hypertension: Secondary | ICD-10-CM

## 2020-06-01 MED ORDER — AMLODIPINE BESYLATE 10 MG PO TABS: 1.0000 | ORAL_TABLET | Freq: Every day | ORAL | 1 refills | Status: DC

## 2020-06-01 MED ORDER — OMEPRAZOLE 20 MG PO CPDR: DELAYED_RELEASE_CAPSULE | ORAL | 1 refills | Status: DC

## 2020-06-01 MED ORDER — FAMOTIDINE 20 MG PO TABS: 20.0000 mg | ORAL_TABLET | Freq: Two times a day (BID) | ORAL | 1 refills | Status: DC

## 2020-06-01 MED ORDER — HYDROCHLOROTHIAZIDE 25 MG PO TABS: 1.0000 | ORAL_TABLET | Freq: Every day | ORAL | 1 refills | Status: DC

## 2020-06-01 NOTE — Patient Instructions (Signed)
F/U 6 months

## 2020-06-01 NOTE — Progress Notes (Signed)
Subjective:    Patient ID: Gina Levine, female    DOB: 01-Apr-1950, 70 y.o.   MRN: [id number]  HPI Patient is here today for follow-up.  She is consistently taking her amlodipine and hydrochlorothiazide.  Her blood pressure today is outstanding at 122/64.  She is still on both famotidine and omeprazole.  However she is not having any heartburn.  I recommended that she try stopping the medications 1 at a time to see if she really needs to stay on them.  She is also still taking her iron which she was taking for iron deficiency anemia.  She is overdue to recheck a CBC.  Her last hemoglobin was 10.8 in August of last year.  She denies any fatigue or chest pain or shortness of breath or dyspnea on exertion.  She cares for her 74 year old sister who has Alzheimer's disease.  She also performs all of her own housework and helps take care of her 20 grandchildren. Immunization History  Administered Date(s) Administered  . Fluad Quad(high Dose 65+) 11/05/2018, 11/25/2019  . Influenza,inj,Quad PF,6+ Mos 11/19/2012, 12/01/2013, 11/24/2015, 12/11/2016, 11/29/2017  . Moderna Sars-Covid-2 Vaccination 03/18/2019, 04/15/2019, 12/22/2019, 05/31/2020  . Pneumococcal Conjugate-13 04/14/2015  . Pneumococcal Polysaccharide-23 03/19/2016  . Tdap 11/19/2012    Past Medical History:  Diagnosis Date  . Anemia   . Depression   . Hypertension   . Nephrolithiasis   . Osteopenia 2017   T=-1.7   Past Surgical History:  Procedure Laterality Date  . LITHOTRIPSY     x 2   Current Outpatient Medications on File Prior to Visit  Medication Sig Dispense Refill  . cholecalciferol (VITAMIN D) 1000 UNITS tablet Take 1,000 Units by mouth daily.    . ferrous sulfate 325 (65 FE) MG EC tablet Take 325 mg by mouth daily with breakfast.     No current facility-administered medications on file prior to visit.   Allergies  Allergen Reactions  . Benazepril Other (See Comments)    "makes toes swell"    Social  History   Socioeconomic History  . Marital status: Married    Spouse name: Not on file  . Number of children: 4  . Years of education: Not on file  . Highest education level: Not on file  Occupational History  . Occupation: Retired  Tobacco Use  . Smoking status: Never Smoker  . Smokeless tobacco: Never Used  Substance and Sexual Activity  . Alcohol use: No  . Drug use: No  . Sexual activity: Not on file  Other Topics Concern  . Not on file  Social History Narrative  . Not on file   Social Determinants of Health   Financial Resource Strain: Not on file  Food Insecurity: Not on file  Transportation Needs: Not on file  Physical Activity: Not on file  Stress: Not on file  Social Connections: Not on file  Intimate Partner Violence: Not on file   Family History  Problem Relation Age of Onset  . Cerebrovascular Accident Other       Review of Systems  All other systems reviewed and are negative.      Objective:   Physical Exam Vitals reviewed.  Constitutional:      General: She is not in acute distress.    Appearance: She is well-developed. She is not diaphoretic.  HENT:     Head: Normocephalic and atraumatic.     Right Ear: External ear normal.     Left Ear: External ear normal.  Nose: Nose normal.     Mouth/Throat:     Pharynx: No oropharyngeal exudate.  Eyes:     General: No scleral icterus.       Right eye: No discharge.        Left eye: No discharge.     Conjunctiva/sclera: Conjunctivae normal.     Pupils: Pupils are equal, round, and reactive to light.  Neck:     Thyroid: No thyromegaly.     Vascular: No JVD.     Trachea: No tracheal deviation.  Cardiovascular:     Rate and Rhythm: Normal rate and regular rhythm.     Heart sounds: Normal heart sounds. No murmur heard. No friction rub. No gallop.   Pulmonary:     Effort: Pulmonary effort is normal. No respiratory distress.     Breath sounds: Normal breath sounds. No stridor. No wheezing or  rales.  Chest:     Chest wall: No tenderness.  Abdominal:     General: Bowel sounds are normal. There is no distension.     Palpations: Abdomen is soft. There is no mass.     Tenderness: There is no abdominal tenderness. There is no guarding or rebound.  Musculoskeletal:        General: No tenderness or deformity. Normal range of motion.     Cervical back: Normal range of motion and neck supple.  Lymphadenopathy:     Cervical: No cervical adenopathy.  Skin:    General: Skin is warm.     Coloration: Skin is not pale.     Findings: No erythema or rash.  Neurological:     Mental Status: She is alert and oriented to person, place, and time.     Cranial Nerves: No cranial nerve deficit.     Motor: No abnormal muscle tone.     Coordination: Coordination normal.     Deep Tendon Reflexes: Reflexes are normal and symmetric.  Psychiatric:        Behavior: Behavior normal.        Thought Content: Thought content normal.        Judgment: Judgment normal.           Assessment & Plan:  Benign essential HTN - Plan: CBC with Differential/Platelet, COMPLETE METABOLIC PANEL WITH GFR   Physical exam today is excellent.  Blood pressure is normal.  Check a CBC and a CMP.  If hemoglobin has normalized she can stop ferrous sulfate.  I have also recommended that she stop famotidine first.  After a couple weeks, if she is not experiencing heartburn I would like her to try to stop omeprazole next to see if it is truly necessary.

## 2020-06-02 ENCOUNTER — Encounter: Payer: Self-pay | Admitting: *Deleted

## 2020-06-02 LAB — COMPLETE METABOLIC PANEL WITH GFR
AG Ratio: 1.6 (calc) (ref 1.0–2.5)
ALT: 12 U/L (ref 6–29)
AST: 14 U/L (ref 10–35)
Albumin: 4.4 g/dL (ref 3.6–5.1)
Alkaline phosphatase (APISO): 81 U/L (ref 37–153)
BUN/Creatinine Ratio: 18 (calc) (ref 6–22)
BUN: 24 mg/dL (ref 7–25)
CO2: 24 mmol/L (ref 20–32)
Calcium: 9.5 mg/dL (ref 8.6–10.4)
Chloride: 104 mmol/L (ref 98–110)
Creat: 1.35 mg/dL — ABNORMAL HIGH (ref 0.60–0.93)
GFR, Est African American: 46 mL/min/{1.73_m2} — ABNORMAL LOW (ref >=60)
GFR, Est Non African American: 40 mL/min/{1.73_m2} — ABNORMAL LOW (ref >=60)
Globulin: 2.7 g/dL (calc) (ref 1.9–3.7)
Glucose, Bld: 119 mg/dL — ABNORMAL HIGH (ref 65–99)
Potassium: 4.2 mmol/L (ref 3.5–5.3)
Sodium: 139 mmol/L (ref 135–146)
Total Bilirubin: 0.6 mg/dL (ref 0.2–1.2)
Total Protein: 7.1 g/dL (ref 6.1–8.1)

## 2020-06-02 LAB — CBC WITH DIFFERENTIAL/PLATELET
Absolute Monocytes: 680 cells/uL (ref 200–950)
Basophils Absolute: 50 cells/uL (ref 0–200)
Basophils Relative: 0.5 %
Eosinophils Absolute: 480 cells/uL (ref 15–500)
Eosinophils Relative: 4.8 %
HCT: 33.6 % — ABNORMAL LOW (ref 35.0–45.0)
Hemoglobin: 10.8 g/dL — ABNORMAL LOW (ref 11.7–15.5)
Lymphs Abs: 2110 cells/uL (ref 850–3900)
MCH: 30 pg (ref 27.0–33.0)
MCHC: 32.1 g/dL (ref 32.0–36.0)
MCV: 93.3 fL (ref 80.0–100.0)
MPV: 12 fL (ref 7.5–12.5)
Monocytes Relative: 6.8 %
Neutro Abs: 6680 cells/uL (ref 1500–7800)
Neutrophils Relative %: 66.8 %
Platelets: 265 10*3/uL (ref 140–400)
RBC: 3.6 10*6/uL — ABNORMAL LOW (ref 3.80–5.10)
RDW: 11.7 % (ref 11.0–15.0)
Total Lymphocyte: 21.1 %
WBC: 10 10*3/uL (ref 3.8–10.8)

## 2020-06-28 ENCOUNTER — Other Ambulatory Visit: Payer: Self-pay | Admitting: Family Medicine

## 2020-07-21 ENCOUNTER — Other Ambulatory Visit: Payer: Self-pay | Admitting: Family Medicine

## 2020-08-13 ENCOUNTER — Other Ambulatory Visit: Payer: Self-pay | Admitting: Family Medicine

## 2020-09-11 ENCOUNTER — Other Ambulatory Visit: Payer: Self-pay | Admitting: Family Medicine

## 2020-10-18 ENCOUNTER — Other Ambulatory Visit: Payer: Self-pay | Admitting: Family Medicine

## 2020-11-08 ENCOUNTER — Ambulatory Visit: Payer: Medicare Other

## 2020-11-11 ENCOUNTER — Ambulatory Visit: Payer: Medicare Other

## 2020-11-11 ENCOUNTER — Telehealth: Payer: Self-pay

## 2020-11-11 NOTE — Telephone Encounter (Signed)
Attempted to reach patient in regards to her Medicare annual wellness visit. Kathrene Alu RN

## 2020-11-15 ENCOUNTER — Ambulatory Visit (INDEPENDENT_AMBULATORY_CARE_PROVIDER_SITE_OTHER): Payer: Medicare Other | Admitting: *Deleted

## 2020-11-15 ENCOUNTER — Other Ambulatory Visit: Payer: Self-pay

## 2020-11-15 DIAGNOSIS — Z23 Encounter for immunization: Secondary | ICD-10-CM

## 2020-11-18 ENCOUNTER — Other Ambulatory Visit: Payer: Self-pay | Admitting: Family Medicine

## 2020-11-29 ENCOUNTER — Other Ambulatory Visit: Payer: Self-pay | Admitting: Family Medicine

## 2020-12-01 ENCOUNTER — Ambulatory Visit: Payer: Medicare Other | Admitting: Family Medicine

## 2020-12-11 ENCOUNTER — Other Ambulatory Visit: Payer: Self-pay | Admitting: Family Medicine

## 2020-12-19 ENCOUNTER — Other Ambulatory Visit: Payer: Medicare Other

## 2020-12-22 ENCOUNTER — Encounter: Payer: Medicare Other | Admitting: Family Medicine

## 2021-01-04 ENCOUNTER — Other Ambulatory Visit: Payer: Self-pay | Admitting: Family Medicine

## 2021-01-23 ENCOUNTER — Other Ambulatory Visit: Payer: Medicare Other

## 2021-01-23 ENCOUNTER — Other Ambulatory Visit: Payer: Self-pay

## 2021-01-23 DIAGNOSIS — Z0000 Encounter for general adult medical examination without abnormal findings: Secondary | ICD-10-CM

## 2021-01-23 DIAGNOSIS — E559 Vitamin D deficiency, unspecified: Secondary | ICD-10-CM

## 2021-01-23 DIAGNOSIS — Z13220 Encounter for screening for lipoid disorders: Secondary | ICD-10-CM

## 2021-01-23 DIAGNOSIS — D649 Anemia, unspecified: Secondary | ICD-10-CM

## 2021-01-24 ENCOUNTER — Other Ambulatory Visit: Payer: Medicare Other

## 2021-01-24 ENCOUNTER — Other Ambulatory Visit: Payer: Self-pay

## 2021-01-24 DIAGNOSIS — D649 Anemia, unspecified: Secondary | ICD-10-CM

## 2021-01-24 DIAGNOSIS — Z13220 Encounter for screening for lipoid disorders: Secondary | ICD-10-CM

## 2021-01-24 DIAGNOSIS — E559 Vitamin D deficiency, unspecified: Secondary | ICD-10-CM

## 2021-01-24 DIAGNOSIS — Z0000 Encounter for general adult medical examination without abnormal findings: Secondary | ICD-10-CM

## 2021-01-25 LAB — COMPREHENSIVE METABOLIC PANEL
AG Ratio: 1.4 (calc) (ref 1.0–2.5)
ALT: 7 U/L (ref 6–29)
AST: 11 U/L (ref 10–35)
Albumin: 4.3 g/dL (ref 3.6–5.1)
Alkaline phosphatase (APISO): 58 U/L (ref 37–153)
BUN/Creatinine Ratio: 15 (calc) (ref 6–22)
BUN: 19 mg/dL (ref 7–25)
CO2: 27 mmol/L (ref 20–32)
Calcium: 9.7 mg/dL (ref 8.6–10.4)
Chloride: 108 mmol/L (ref 98–110)
Creat: 1.24 mg/dL — ABNORMAL HIGH (ref 0.60–1.00)
Globulin: 3 g/dL (calc) (ref 1.9–3.7)
Glucose, Bld: 110 mg/dL — ABNORMAL HIGH (ref 65–99)
Potassium: 4.4 mmol/L (ref 3.5–5.3)
Sodium: 143 mmol/L (ref 135–146)
Total Bilirubin: 0.5 mg/dL (ref 0.2–1.2)
Total Protein: 7.3 g/dL (ref 6.1–8.1)

## 2021-01-25 LAB — CBC WITH DIFFERENTIAL/PLATELET
Absolute Monocytes: 382 cells/uL (ref 200–950)
Basophils Absolute: 50 cells/uL (ref 0–200)
Basophils Relative: 0.7 %
Eosinophils Absolute: 382 cells/uL (ref 15–500)
Eosinophils Relative: 5.3 %
HCT: 32.5 % — ABNORMAL LOW (ref 35.0–45.0)
Hemoglobin: 10.4 g/dL — ABNORMAL LOW (ref 11.7–15.5)
Lymphs Abs: 1973 cells/uL (ref 850–3900)
MCH: 30.2 pg (ref 27.0–33.0)
MCHC: 32 g/dL (ref 32.0–36.0)
MCV: 94.5 fL (ref 80.0–100.0)
MPV: 12.5 fL (ref 7.5–12.5)
Monocytes Relative: 5.3 %
Neutro Abs: 4414 cells/uL (ref 1500–7800)
Neutrophils Relative %: 61.3 %
Platelets: 261 10*3/uL (ref 140–400)
RBC: 3.44 10*6/uL — ABNORMAL LOW (ref 3.80–5.10)
RDW: 11.8 % (ref 11.0–15.0)
Total Lymphocyte: 27.4 %
WBC: 7.2 10*3/uL (ref 3.8–10.8)

## 2021-01-25 LAB — LIPID PANEL
Cholesterol: 204 mg/dL — ABNORMAL HIGH (ref <200)
HDL: 49 mg/dL — ABNORMAL LOW (ref >=50)
LDL Cholesterol (Calc): 133 mg/dL (calc) — ABNORMAL HIGH
Non-HDL Cholesterol (Calc): 155 mg/dL (calc) — ABNORMAL HIGH (ref <130)
Total CHOL/HDL Ratio: 4.2 (calc) (ref <5.0)
Triglycerides: 113 mg/dL (ref <150)

## 2021-01-25 LAB — VITAMIN D 25 HYDROXY (VIT D DEFICIENCY, FRACTURES): Vit D, 25-Hydroxy: 36 ng/mL (ref 30–100)

## 2021-01-27 ENCOUNTER — Encounter: Payer: Self-pay | Admitting: Family Medicine

## 2021-01-27 ENCOUNTER — Other Ambulatory Visit: Payer: Self-pay

## 2021-01-27 ENCOUNTER — Ambulatory Visit (INDEPENDENT_AMBULATORY_CARE_PROVIDER_SITE_OTHER): Payer: Medicare Other | Admitting: Family Medicine

## 2021-01-27 VITALS — BP 120/82 | HR 84 | Ht 62.0 in | Wt 181.0 lb

## 2021-01-27 DIAGNOSIS — Z0000 Encounter for general adult medical examination without abnormal findings: Secondary | ICD-10-CM

## 2021-01-27 DIAGNOSIS — M25561 Pain in right knee: Secondary | ICD-10-CM

## 2021-01-27 DIAGNOSIS — M8580 Other specified disorders of bone density and structure, unspecified site: Secondary | ICD-10-CM | POA: Diagnosis not present

## 2021-01-27 DIAGNOSIS — N183 Chronic kidney disease, stage 3 unspecified: Secondary | ICD-10-CM | POA: Diagnosis not present

## 2021-01-27 DIAGNOSIS — I10 Essential (primary) hypertension: Secondary | ICD-10-CM

## 2021-01-27 DIAGNOSIS — D649 Anemia, unspecified: Secondary | ICD-10-CM

## 2021-01-27 NOTE — Progress Notes (Signed)
Subjective:    Patient ID: Gina Levine, female    DOB: 09/27/1950, 70 y.o.   MRN: [id number]  HPI Patient is a very sweet 70 year old African-American female who presents today for complete physical exam.  Her immunizations are up-to-date except for the shingles vaccine.  She denies any falls or depression or memory loss.  Her last colonoscopy was performed in 2014.  They recommended a repeat colonoscopy in 10 years.  Mammogram was performed earlier this year in [place].  due to age she does not require a Pap smear.  He does complain of bilateral knee pain right greater than left.  Reports locking and laxity.  Feels like the knee will give way on her.  Knee has been hurting with standing and walking for more than 2 months.  Unable to take NSAIDs due to chronic kidney disease. Immunization History  Administered Date(s) Administered   Fluad Quad(high Dose 65+) 11/05/2018, 11/25/2019, 11/15/2020   Influenza,inj,Quad PF,6+ Mos 11/19/2012, 12/01/2013, 11/24/2015, 12/11/2016, 11/29/2017   Moderna Covid-19 Vaccine Bivalent Booster 25yrs & up 11/22/2020   Moderna Sars-Covid-2 Vaccination 03/18/2019, 04/15/2019, 12/22/2019, 05/31/2020   Pneumococcal Conjugate-13 04/14/2015   Pneumococcal Polysaccharide-23 03/19/2016   Tdap 11/19/2012    Past Medical History:  Diagnosis Date   Anemia    Depression    Hypertension    Nephrolithiasis    Osteopenia 2017   T=-1.7   Past Surgical History:  Procedure Laterality Date   LITHOTRIPSY     x 2   Current Outpatient Medications on File Prior to Visit  Medication Sig Dispense Refill   amLODipine (NORVASC) 10 MG tablet Take 1 tablet (10 mg total) by mouth daily. 90 tablet 1   cholecalciferol (VITAMIN D) 1000 UNITS tablet Take 1,000 Units by mouth daily.     famotidine (PEPCID) 20 MG tablet TAKE 1 TABLET BY MOUTH TWICE A DAY 180 tablet 1   ferrous sulfate 325 (65 FE) MG EC tablet Take 325 mg by mouth daily with breakfast.     hydrochlorothiazide  (HYDRODIURIL) 25 MG tablet TAKE 1 TABLET (25 MG TOTAL) BY MOUTH DAILY. 90 tablet 1   omeprazole (PRILOSEC) 20 MG capsule TAKE 1 CAPSULE BY MOUTH EVERY DAY 90 capsule 3   No current facility-administered medications on file prior to visit.   Allergies  Allergen Reactions   Benazepril Other (See Comments)    "makes toes swell"    Social History   Socioeconomic History   Marital status: Married    Spouse name: Not on file   Number of children: 4   Years of education: Not on file   Highest education level: Not on file  Occupational History   Occupation: Retired  Tobacco Use   Smoking status: Never   Smokeless tobacco: Never  Substance and Sexual Activity   Alcohol use: No   Drug use: No   Sexual activity: Not on file  Other Topics Concern   Not on file  Social History Narrative   Not on file   Social Determinants of Health   Financial Resource Strain: Not on file  Food Insecurity: Not on file  Transportation Needs: Not on file  Physical Activity: Not on file  Stress: Not on file  Social Connections: Not on file  Intimate Partner Violence: Not on file   Family History  Problem Relation Age of Onset   Cerebrovascular Accident Other       Review of Systems  All other systems reviewed and are negative.  Objective:   Physical Exam Vitals reviewed.  Constitutional:      General: She is not in acute distress.    Appearance: She is well-developed. She is not diaphoretic.  HENT:     Head: Normocephalic and atraumatic.     Right Ear: External ear normal.     Left Ear: External ear normal.     Nose: Nose normal.     Mouth/Throat:     Pharynx: No oropharyngeal exudate.  Eyes:     General: No scleral icterus.       Right eye: No discharge.        Left eye: No discharge.     Conjunctiva/sclera: Conjunctivae normal.     Pupils: Pupils are equal, round, and reactive to light.  Neck:     Thyroid: No thyromegaly.     Vascular: No JVD.     Trachea: No tracheal  deviation.  Cardiovascular:     Rate and Rhythm: Normal rate and regular rhythm.     Heart sounds: Normal heart sounds. No murmur heard.   No friction rub. No gallop.  Pulmonary:     Effort: Pulmonary effort is normal. No respiratory distress.     Breath sounds: Normal breath sounds. No stridor. No wheezing or rales.  Chest:     Chest wall: No tenderness.  Abdominal:     General: Bowel sounds are normal. There is no distension.     Palpations: Abdomen is soft. There is no mass.     Tenderness: There is no abdominal tenderness. There is no guarding or rebound.  Musculoskeletal:        General: No tenderness or deformity.     Cervical back: Normal range of motion and neck supple.     Right knee: Effusion and crepitus present. Decreased range of motion.     Left knee: No crepitus. Decreased range of motion.  Lymphadenopathy:     Cervical: No cervical adenopathy.  Skin:    General: Skin is warm.     Coloration: Skin is not pale.     Findings: No erythema or rash.  Neurological:     Mental Status: She is alert and oriented to person, place, and time.     Cranial Nerves: No cranial nerve deficit.     Motor: No abnormal muscle tone.     Coordination: Coordination normal.     Deep Tendon Reflexes: Reflexes are normal and symmetric.  Psychiatric:        Behavior: Behavior normal.        Thought Content: Thought content normal.        Judgment: Judgment normal.          Assessment & Plan:  Acute pain of right knee - Plan: DG Knee Complete 4 Views Right  General medical exam  Stage 3 chronic kidney disease, unspecified whether stage 3a or 3b CKD (HCC)  Osteopenia, unspecified location  Benign essential HTN I suspect the knee pain is due to osteoarthritis.  Proceed with an x-ray of the right knee.  Patient elects to receive a cortisone injection today.  Using sterile technique, I injected the right knee with 2 cc lidocaine, 2 cc of Marcaine, and 2 cc of 40 mg/mL Kenalog.  The  patient tolerated the procedure well without complication.  Blood pressure today is excellent 120/82.  Colonoscopy, mammogram are both up-to-date.  Reviewed her most recent lab work with her: Appointment on 01/24/2021  Component Date Value Ref Range Status   Vit D,  25-Hydroxy 01/24/2021 36  30 - 100 ng/mL Final   Comment: Vitamin D Status         25-OH Vitamin D: . Deficiency:                    <20 ng/mL Insufficiency:             20 - 29 ng/mL Optimal:                 > or = 30 ng/mL . For 25-OH Vitamin D testing on patients on  D2-supplementation and patients for whom quantitation  of D2 and D3 fractions is required, the [facility](TM) 25-OH VIT D, (D2,D3), LC/MS/MS is recommended: order  code [id number] (patients >60yrs). See Note 1 . Note 1 . For additional information, please refer to  http://education.[facility].com/faq/FAQ199  (This link is being provided for informational/ educational purposes only.)    Cholesterol 01/24/2021 204 (H)  <200 mg/dL Final   HDL 01/24/2021 49 (L)  > OR = 50 mg/dL Final   Triglycerides 01/24/2021 113  <150 mg/dL Final   LDL Cholesterol (Calc) 01/24/2021 133 (H)  mg/dL (calc) Final   Comment: Reference range: <100 . Desirable range <100 mg/dL for primary prevention;   <70 mg/dL for patients with CHD or diabetic patients  with > or = 2 CHD risk factors. Marland Kitchen LDL-C is now calculated using the Martin-Hopkins  calculation, which is a validated novel method providing  better accuracy than the Friedewald equation in the  estimation of LDL-C.  Cresenciano Genre et al. Annamaria Helling. 1275;170(01): 2061-2068  (http://education.[facility].com/faq/FAQ164)    Total CHOL/HDL Ratio 01/24/2021 4.2  <5.0 (calc) Final   Non-HDL Cholesterol (Calc) 01/24/2021 155 (H)  <130 mg/dL (calc) Final   Comment: For patients with diabetes plus 1 major ASCVD risk  factor, treating to a non-HDL-C goal of <100 mg/dL  (LDL-C of <70 mg/dL) is considered a therapeutic  option.     Glucose, Bld 01/24/2021 110 (H)  65 - 99 mg/dL Final   Comment: .            Fasting reference interval . For someone without known diabetes, a glucose value between 100 and 125 mg/dL is consistent with prediabetes and should be confirmed with a follow-up test. .    BUN 01/24/2021 19  7 - 25 mg/dL Final   Creat 01/24/2021 1.24 (H)  0.60 - 1.00 mg/dL Final   BUN/Creatinine Ratio 01/24/2021 15  6 - 22 (calc) Final   Sodium 01/24/2021 143  135 - 146 mmol/L Final   Potassium 01/24/2021 4.4  3.5 - 5.3 mmol/L Final   Chloride 01/24/2021 108  98 - 110 mmol/L Final   CO2 01/24/2021 27  20 - 32 mmol/L Final   Calcium 01/24/2021 9.7  8.6 - 10.4 mg/dL Final   Total Protein 01/24/2021 7.3  6.1 - 8.1 g/dL Final   Albumin 01/24/2021 4.3  3.6 - 5.1 g/dL Final   Globulin 01/24/2021 3.0  1.9 - 3.7 g/dL (calc) Final   AG Ratio 01/24/2021 1.4  1.0 - 2.5 (calc) Final   Total Bilirubin 01/24/2021 0.5  0.2 - 1.2 mg/dL Final   Alkaline phosphatase (APISO) 01/24/2021 58  37 - 153 U/L Final   AST 01/24/2021 11  10 - 35 U/L Final   ALT 01/24/2021 7  6 - 29 U/L Final   WBC 01/24/2021 7.2  3.8 - 10.8 Thousand/uL Final   RBC 01/24/2021 3.44 (L)  3.80 - 5.10 Million/uL Final  Hemoglobin 01/24/2021 10.4 (L)  11.7 - 15.5 g/dL Final   HCT 01/24/2021 32.5 (L)  35.0 - 45.0 % Final   MCV 01/24/2021 94.5  80.0 - 100.0 fL Final   MCH 01/24/2021 30.2  27.0 - 33.0 pg Final   MCHC 01/24/2021 32.0  32.0 - 36.0 g/dL Final   RDW 01/24/2021 11.8  11.0 - 15.0 % Final   Platelets 01/24/2021 261  140 - 400 Thousand/uL Final   MPV 01/24/2021 12.5  7.5 - 12.5 fL Final   Neutro Abs 01/24/2021 4,414  1,500 - 7,800 cells/uL Final   Lymphs Abs 01/24/2021 1,973  850 - 3,900 cells/uL Final   Absolute Monocytes 01/24/2021 382  200 - 950 cells/uL Final   Eosinophils Absolute 01/24/2021 382  15 - 500 cells/uL Final   Basophils Absolute 01/24/2021 50  0 - 200 cells/uL Final   Neutrophils Relative % 01/24/2021 61.3  % Final   Total  Lymphocyte 01/24/2021 27.4  % Final   Monocytes Relative 01/24/2021 5.3  % Final   Eosinophils Relative 01/24/2021 5.3  % Final   Basophils Relative 01/24/2021 0.7  % Final   Labs showed mild elevation in cholesterol, stable chronic kidney disease, and anemia.  Anemia is chronic and has been present for several years with no significant change.  Recommended the shingles vaccine.

## 2021-01-30 ENCOUNTER — Ambulatory Visit (HOSPITAL_COMMUNITY)
Admission: RE | Admit: 2021-01-30 | Discharge: 2021-01-30 | Disposition: A | Discharge status: Home / Self Care | Payer: Medicare Other | Source: Ambulatory Visit | Attending: Family Medicine | Admitting: Family Medicine

## 2021-01-30 ENCOUNTER — Other Ambulatory Visit: Payer: Self-pay

## 2021-01-30 DIAGNOSIS — M25561 Pain in right knee: Secondary | ICD-10-CM | POA: Diagnosis not present

## 2021-01-31 ENCOUNTER — Telehealth: Payer: Self-pay

## 2021-01-31 NOTE — Telephone Encounter (Signed)
-----   Message from Susy Frizzle, MD sent at 01/31/2021  6:43 AM EST ----- X-ray shows arthritis underneath her kneecap and on the inside part of her knee joint.  This would likely explain her knee pain.  Hopefully is getting better with the cortisone shot.

## 2021-01-31 NOTE — Telephone Encounter (Signed)
Informed patient of results and recommendations. 

## 2021-03-09 ENCOUNTER — Other Ambulatory Visit: Payer: Self-pay | Admitting: Family Medicine

## 2021-06-07 ENCOUNTER — Other Ambulatory Visit: Payer: Self-pay | Admitting: Family Medicine

## 2021-08-04 ENCOUNTER — Other Ambulatory Visit: Payer: Self-pay | Admitting: Family Medicine

## 2021-12-07 ENCOUNTER — Other Ambulatory Visit: Payer: Self-pay | Admitting: Family Medicine

## 2021-12-27 ENCOUNTER — Other Ambulatory Visit: Payer: Self-pay | Admitting: Family Medicine

## 2022-01-29 ENCOUNTER — Encounter: Payer: Self-pay | Admitting: Family Medicine

## 2022-01-29 ENCOUNTER — Ambulatory Visit (INDEPENDENT_AMBULATORY_CARE_PROVIDER_SITE_OTHER): Payer: Medicare Other | Admitting: Family Medicine

## 2022-01-29 VITALS — BP 126/78 | HR 76 | Ht 62.0 in | Wt 172.8 lb

## 2022-01-29 DIAGNOSIS — Z0000 Encounter for general adult medical examination without abnormal findings: Secondary | ICD-10-CM | POA: Diagnosis not present

## 2022-01-29 DIAGNOSIS — I10 Essential (primary) hypertension: Secondary | ICD-10-CM

## 2022-01-29 DIAGNOSIS — M8580 Other specified disorders of bone density and structure, unspecified site: Secondary | ICD-10-CM

## 2022-01-29 NOTE — Progress Notes (Signed)
Subjective:    Patient ID: Gina Levine, female    DOB: [birth year], 71 y.o.   MRN: [id number]  HPI Patient is a very sweet 71 year old African-American female who is here today for complete physical exam.  She denies any problems with falls, memory loss, or depression.  She has had her flu shot, her new COVID booster, RSV vaccine, and her shingles vaccine through local pharmacy.  Colonoscopy was done in 2014 and is due next year.  Her mammogram was performed earlier this year and [facility].  Her last Pap smear was at age 8 and does not require another Pap smear.  Overall she is doing well.  She is currently Christmas shopping for her 20 grandchildren! Immunization History  Administered Date(s) Administered   Fluad Quad(high Dose 72+) 11/05/2018, 11/25/2019, 11/15/2020   Influenza,inj,Quad PF,6+ Mos 11/19/2012, 12/01/2013, 11/24/2015, 12/11/2016, 11/29/2017   Moderna Covid-19 Vaccine Bivalent Booster 32yr & up 11/22/2020   Moderna Sars-Covid-2 Vaccination 03/18/2019, 04/15/2019, 12/22/2019, 05/31/2020   Pneumococcal Conjugate-13 04/14/2015   Pneumococcal Polysaccharide-23 03/19/2016   Tdap 11/19/2012    Past Medical History:  Diagnosis Date   Anemia    Depression    Hypertension    Nephrolithiasis    Osteopenia 2017   T=-1.7   Past Surgical History:  Procedure Laterality Date   LITHOTRIPSY     x 2   Current Outpatient Medications on File Prior to Visit  Medication Sig Dispense Refill   amLODipine (NORVASC) 10 MG tablet TAKE 1 TABLET BY MOUTH EVERY DAY 90 tablet 1   cholecalciferol (VITAMIN D) 1000 UNITS tablet Take 1,000 Units by mouth daily.     famotidine (PEPCID) 20 MG tablet TAKE 1 TABLET BY MOUTH TWICE A DAY 180 tablet 1   ferrous sulfate 325 (65 FE) MG EC tablet Take 325 mg by mouth daily with breakfast.     hydrochlorothiazide (HYDRODIURIL) 25 MG tablet TAKE 1 TABLET (25 MG TOTAL) BY MOUTH DAILY. 90 tablet 1   omeprazole (PRILOSEC) 20 MG capsule TAKE 1 CAPSULE BY  MOUTH EVERY DAY 90 capsule 3   No current facility-administered medications on file prior to visit.   Allergies  Allergen Reactions   Benazepril Other (See Comments)    "makes toes swell"    Social History   Socioeconomic History   Marital status: Married    Spouse name: Not on file   Number of children: 4   Years of education: Not on file   Highest education level: Not on file  Occupational History   Occupation: Retired  Tobacco Use   Smoking status: Never   Smokeless tobacco: Never  Substance and Sexual Activity   Alcohol use: No   Drug use: No   Sexual activity: Not on file  Other Topics Concern   Not on file  Social History Narrative   Lost sister in [place] 2023.   20 grandchildren.   Social Determinants of Health   Financial Resource Strain: Not on file  Food Insecurity: Not on file  Transportation Needs: Not on file  Physical Activity: Not on file  Stress: Not on file  Social Connections: Not on file  Intimate Partner Violence: Not on file   Family History  Problem Relation Age of Onset   Cerebrovascular Accident Other       Review of Systems  All other systems reviewed and are negative.      Objective:   Physical Exam Vitals reviewed.  Constitutional:      General: She is not  in acute distress.    Appearance: She is well-developed. She is not diaphoretic.  HENT:     Head: Normocephalic and atraumatic.     Right Ear: External ear normal.     Left Ear: External ear normal.     Nose: Nose normal.     Mouth/Throat:     Pharynx: No oropharyngeal exudate.  Eyes:     General: No scleral icterus.       Right eye: No discharge.        Left eye: No discharge.     Conjunctiva/sclera: Conjunctivae normal.     Pupils: Pupils are equal, round, and reactive to light.  Neck:     Thyroid: No thyromegaly.     Vascular: No JVD.     Trachea: No tracheal deviation.  Cardiovascular:     Rate and Rhythm: Normal rate and regular rhythm.     Heart sounds:  Normal heart sounds. No murmur heard.    No friction rub. No gallop.  Pulmonary:     Effort: Pulmonary effort is normal. No respiratory distress.     Breath sounds: Normal breath sounds. No stridor. No wheezing or rales.  Chest:     Chest wall: No tenderness.  Abdominal:     General: Bowel sounds are normal. There is no distension.     Palpations: Abdomen is soft. There is no mass.     Tenderness: There is no abdominal tenderness. There is no guarding or rebound.  Musculoskeletal:        General: No tenderness or deformity.     Cervical back: Normal range of motion and neck supple.     Right knee: Effusion and crepitus present. Decreased range of motion.     Left knee: No crepitus. Decreased range of motion.  Lymphadenopathy:     Cervical: No cervical adenopathy.  Skin:    General: Skin is warm.     Coloration: Skin is not pale.     Findings: No erythema or rash.  Neurological:     Mental Status: She is alert and oriented to person, place, and time.     Cranial Nerves: No cranial nerve deficit.     Motor: No abnormal muscle tone.     Coordination: Coordination normal.     Deep Tendon Reflexes: Reflexes are normal and symmetric.  Psychiatric:        Behavior: Behavior normal.        Thought Content: Thought content normal.        Judgment: Judgment normal.           Assessment & Plan:  Osteopenia, unspecified location - Plan: DG Bone Density  Benign essential HTN - Plan: CBC with Differential/Platelet, Lipid panel, COMPLETE METABOLIC PANEL WITH GFR  General medical exam Blood pressure today is outstanding.  Physical exam is completely normal.  Immunizations are up-to-date.  Cancer screening is up-to-date.  She does have a history of osteopenia so recommended 12 mg of calcium 1000 units a day of vitamin D.  Check CBC CMP and a lipid panel.  Schedule the patient for a follow-up bone density test.  She is exercising daily and performing all of her yard work.  She lives alone  but denies any depression or memory loss or falls.

## 2022-01-30 LAB — CBC WITH DIFFERENTIAL/PLATELET
Absolute Monocytes: 370 cells/uL (ref 200–950)
Basophils Absolute: 53 cells/uL (ref 0–200)
Basophils Relative: 0.8 %
Eosinophils Absolute: 389 cells/uL (ref 15–500)
Eosinophils Relative: 5.9 %
HCT: 33.8 % — ABNORMAL LOW (ref 35.0–45.0)
Hemoglobin: 10.7 g/dL — ABNORMAL LOW (ref 11.7–15.5)
Lymphs Abs: 2132 cells/uL (ref 850–3900)
MCH: 30.2 pg (ref 27.0–33.0)
MCHC: 31.7 g/dL — ABNORMAL LOW (ref 32.0–36.0)
MCV: 95.5 fL (ref 80.0–100.0)
MPV: 12.5 fL (ref 7.5–12.5)
Monocytes Relative: 5.6 %
Neutro Abs: 3656 cells/uL (ref 1500–7800)
Neutrophils Relative %: 55.4 %
Platelets: 269 10*3/uL (ref 140–400)
RBC: 3.54 10*6/uL — ABNORMAL LOW (ref 3.80–5.10)
RDW: 11.6 % (ref 11.0–15.0)
Total Lymphocyte: 32.3 %
WBC: 6.6 10*3/uL (ref 3.8–10.8)

## 2022-01-30 LAB — COMPLETE METABOLIC PANEL WITH GFR
AG Ratio: 1.6 (calc) (ref 1.0–2.5)
ALT: 12 U/L (ref 6–29)
AST: 15 U/L (ref 10–35)
Albumin: 4.7 g/dL (ref 3.6–5.1)
Alkaline phosphatase (APISO): 64 U/L (ref 37–153)
BUN/Creatinine Ratio: 16 (calc) (ref 6–22)
BUN: 19 mg/dL (ref 7–25)
CO2: 27 mmol/L (ref 20–32)
Calcium: 9.7 mg/dL (ref 8.6–10.4)
Chloride: 105 mmol/L (ref 98–110)
Creat: 1.2 mg/dL — ABNORMAL HIGH (ref 0.60–1.00)
Globulin: 2.9 g/dL (calc) (ref 1.9–3.7)
Glucose, Bld: 93 mg/dL (ref 65–99)
Potassium: 4.7 mmol/L (ref 3.5–5.3)
Sodium: 142 mmol/L (ref 135–146)
Total Bilirubin: 0.5 mg/dL (ref 0.2–1.2)
Total Protein: 7.6 g/dL (ref 6.1–8.1)
eGFR: 48 mL/min/{1.73_m2} — ABNORMAL LOW (ref >=60)

## 2022-01-30 LAB — LIPID PANEL
Cholesterol: 209 mg/dL — ABNORMAL HIGH (ref <200)
HDL: 54 mg/dL (ref >=50)
LDL Cholesterol (Calc): 130 mg/dL (calc) — ABNORMAL HIGH
Non-HDL Cholesterol (Calc): 155 mg/dL (calc) — ABNORMAL HIGH (ref <130)
Total CHOL/HDL Ratio: 3.9 (calc) (ref <5.0)
Triglycerides: 130 mg/dL (ref <150)

## 2022-01-31 ENCOUNTER — Telehealth: Payer: Self-pay

## 2022-01-31 NOTE — Telephone Encounter (Signed)
Pt returned call. Spoke w/pt and is aware of lab results and voiced understanding.   Nothing further.

## 2022-02-14 ENCOUNTER — Telehealth: Payer: Self-pay

## 2022-02-14 NOTE — Telephone Encounter (Signed)
Pt called stated that she work up w/her eyes being red w/some crust on her eyes. Pt asked what she can do OTC? Told pt she can try some Visine for red eyes OTC/and ask the pharmacist what he recommends for red eyes.   If the problem gets worst/meds don't work for her to call pcp office to make an appt.   Pt voiced understanding and nothing further.

## 2022-03-08 ENCOUNTER — Other Ambulatory Visit: Payer: Self-pay | Admitting: Family Medicine

## 2022-03-08 MED ORDER — AMLODIPINE BESYLATE 10 MG PO TABS: 10.0000 mg | ORAL_TABLET | Freq: Every day | ORAL | 1 refills | Status: AC

## 2022-03-08 NOTE — Telephone Encounter (Signed)
Prescription Request  03/08/2022  Is this a "Controlled Substance" medicine? No  LOV: 01/29/2022  What is the name of the medication or equipment?   amLODipine (NORVASC) 10 MG tablet   Have you contacted your pharmacy to request a refill? Yes   Which pharmacy would you like this sent to?  [id number] [place] [facility] [address] [id number]: [phone]: [phone]   Patient notified that their request is being sent to the clinical staff for review and that they should receive a response within 2 business days.   Please advise pharmacist at [phone]

## 2022-03-08 NOTE — Telephone Encounter (Signed)
Requested Prescriptions  Pending Prescriptions Disp Refills   amLODipine (NORVASC) 10 MG tablet 90 tablet 1    Sig: Take 1 tablet (10 mg total) by mouth daily.     Cardiovascular: Calcium Channel Blockers 2 Failed - 03/08/2022  2:51 PM      Failed - Valid encounter within last 6 months    Recent Outpatient Visits           1 year ago Acute pain of right knee   [place] Pickard, Cammie Mcgee, MD   1 year ago Benign essential HTN   [place] Dennard Schaumann, Cammie Mcgee, MD   2 years ago Anemia, unspecified type   [place] Pickard, Cammie Mcgee, MD   3 years ago General medical exam   [place] Susy Frizzle, MD   4 years ago Benign essential HTN   [place] Pickard, Cammie Mcgee, MD       Future Appointments             In 7 months Pickard, Cammie Mcgee, MD [facility]            Passed - Last BP in normal range    BP Readings from Last 1 Encounters:  01/29/22 126/78         Passed - Last Heart Rate in normal range    Pulse Readings from Last 1 Encounters:  01/29/22 76

## 2022-05-10 ENCOUNTER — Other Ambulatory Visit: Payer: Self-pay | Admitting: Family Medicine

## 2022-05-14 ENCOUNTER — Other Ambulatory Visit: Payer: Self-pay | Admitting: Family Medicine

## 2022-05-15 NOTE — Telephone Encounter (Signed)
Requested by interface surescripts. Last refill doc. 05/10/22 #90 1 refill. Requested too soon.  Requested Prescriptions  Refused Prescriptions Disp Refills   hydrochlorothiazide (HYDRODIURIL) 25 MG tablet [Pharmacy Med Name: HYDROCHLOROTHIAZIDE 25 MG TAB] 90 tablet 1    Sig: TAKE 1 TABLET (25 MG TOTAL) BY MOUTH DAILY.     Cardiovascular: Diuretics - Thiazide Failed - 05/15/2022 10:38 AM      Failed - Cr in normal range and within 180 days    Creat  Date Value Ref Range Status  01/29/2022 1.20 (H) 0.60 - 1.00 mg/dL Final         Failed - Valid encounter within last 6 months    Recent Outpatient Visits           1 year ago Acute pain of right knee   [place] Pickard, Cammie Mcgee, MD   1 year ago Benign essential HTN   [place] Dennard Schaumann, Cammie Mcgee, MD   2 years ago Anemia, unspecified type   [place] Pickard, Cammie Mcgee, MD   3 years ago General medical exam   [place] Susy Frizzle, MD   5 years ago Benign essential HTN   [place] Susy Frizzle, MD       Future Appointments             In 8 months Pickard, Cammie Mcgee, MD [facility]            Passed - K in normal range and within 180 days    Potassium  Date Value Ref Range Status  01/29/2022 4.7 3.5 - 5.3 mmol/L Final         Passed - Na in normal range and within 180 days    Sodium  Date Value Ref Range Status  01/29/2022 142 135 - 146 mmol/L Final         Passed - Last BP in normal range    BP Readings from Last 1 Encounters:  01/29/22 126/78

## 2022-06-27 LAB — HM MAMMOGRAPHY

## 2022-08-02 ENCOUNTER — Other Ambulatory Visit: Payer: Self-pay | Admitting: Family Medicine

## 2022-08-02 NOTE — Telephone Encounter (Signed)
Requested Prescriptions  Pending Prescriptions Disp Refills   hydrochlorothiazide (HYDRODIURIL) 25 MG tablet [Pharmacy Med Name: HYDROCHLOROTHIAZIDE 25 MG TAB] 90 tablet 0    Sig: TAKE 1 TABLET (25 MG TOTAL) BY MOUTH DAILY.     Cardiovascular: Diuretics - Thiazide Failed - 08/02/2022  2:14 PM      Failed - Cr in normal range and within 180 days    Creat  Date Value Ref Range Status  01/29/2022 1.20 (H) 0.60 - 1.00 mg/dL Final         Failed - K in normal range and within 180 days    Potassium  Date Value Ref Range Status  01/29/2022 4.7 3.5 - 5.3 mmol/L Final         Failed - Na in normal range and within 180 days    Sodium  Date Value Ref Range Status  01/29/2022 142 135 - 146 mmol/L Final         Failed - Valid encounter within last 6 months    Recent Outpatient Visits           1 year ago Acute pain of right knee   [facility] Donita Brooks, MD   2 years ago Benign essential HTN   [facility] Tanya Nones, Priscille Heidelberg, MD   2 years ago Anemia, unspecified type   [facility] Pickard, Priscille Heidelberg, MD   3 years ago General medical exam   [facility] Donita Brooks, MD   5 years ago Benign essential HTN   [facility] Pickard, Priscille Heidelberg, MD       Future Appointments             In 6 months Pickard, Priscille Heidelberg, MD [place] [facility]            Passed - Last BP in normal range    BP Readings from Last 1 Encounters:  01/29/22 126/78

## 2022-08-13 ENCOUNTER — Other Ambulatory Visit: Payer: Self-pay | Admitting: Family Medicine

## 2022-08-13 NOTE — Telephone Encounter (Signed)
Prescription Request  08/13/2022  LOV: 01/29/2022  What is the name of the medication or equipment?   famotidine (PEPCID) 20 MG tablet   Have you contacted your pharmacy to request a refill? Yes   Which pharmacy would you like this sent to?  [id number] [place] - [address] [place] [address] Phone: [phone] Fax: [phone]    Patient notified that their request is being sent to the clinical staff for review and that they should receive a response within 2 business days.   Please advise pharmacist.

## 2022-08-14 MED ORDER — FAMOTIDINE 20 MG PO TABS: 20.0000 mg | ORAL_TABLET | Freq: Two times a day (BID) | ORAL | 1 refills | Status: DC

## 2022-08-14 NOTE — Telephone Encounter (Signed)
Requested Prescriptions  Pending Prescriptions Disp Refills   famotidine (PEPCID) 20 MG tablet 180 tablet 1    Sig: Take 1 tablet (20 mg total) by mouth 2 (two) times daily.     Gastroenterology:  H2 Antagonists Failed - 08/13/2022  1:53 PM      Failed - Valid encounter within last 12 months    Recent Outpatient Visits           1 year ago Acute pain of right knee   [facility] Donita Brooks, MD   2 years ago Benign essential HTN   [facility] Tanya Nones, Priscille Heidelberg, MD   2 years ago Anemia, unspecified type   [facility] Tanya Nones Priscille Heidelberg, MD   3 years ago General medical exam   [facility] Donita Brooks, MD   5 years ago Benign essential HTN   [facility] Pickard, Priscille Heidelberg, MD       Future Appointments             In 5 months Pickard, Priscille Heidelberg, MD [facility]

## 2022-11-02 ENCOUNTER — Other Ambulatory Visit: Payer: Self-pay | Admitting: Family Medicine

## 2022-11-05 NOTE — Telephone Encounter (Signed)
Requested medications are due for refill today.  yes  Requested medications are on the active medications list.  yes  Last refill. 08/02/2022 #90 0 rf  Future visit scheduled.   yes  Notes to clinic.  Pt last seen 01/29/2022 - pt is more than 3 months overdue for OV.    Requested Prescriptions  Pending Prescriptions Disp Refills   hydrochlorothiazide (HYDRODIURIL) 25 MG tablet [Pharmacy Med Name: HYDROCHLOROTHIAZIDE 25 MG TAB] 90 tablet 0    Sig: TAKE 1 TABLET (25 MG TOTAL) BY MOUTH DAILY.     Cardiovascular: Diuretics - Thiazide Failed - 11/02/2022  2:50 AM      Failed - Cr in normal range and within 180 days    Creat  Date Value Ref Range Status  01/29/2022 1.20 (H) 0.60 - 1.00 mg/dL Final         Failed - K in normal range and within 180 days    Potassium  Date Value Ref Range Status  01/29/2022 4.7 3.5 - 5.3 mmol/L Final         Failed - Na in normal range and within 180 days    Sodium  Date Value Ref Range Status  01/29/2022 142 135 - 146 mmol/L Final         Failed - Valid encounter within last 6 months    Recent Outpatient Visits           1 year ago Acute pain of right knee   [facility] Donita Brooks, MD   2 years ago Benign essential HTN   [facility] Tanya Nones, Priscille Heidelberg, MD   3 years ago Anemia, unspecified type   [facility] Pickard, Priscille Heidelberg, MD   4 years ago General medical exam   [facility] Donita Brooks, MD   5 years ago Benign essential HTN   [facility] Pickard, Priscille Heidelberg, MD       Future Appointments             In 2 months Pickard, Priscille Heidelberg, MD [facility]            Passed - Last BP in normal range    BP Readings from Last 1 Encounters:  01/29/22 126/78

## 2022-11-08 NOTE — Telephone Encounter (Signed)
Med refill appointment scheduled for 11/09/22; cpe scheduled for 02/01/2023.

## 2022-11-09 ENCOUNTER — Ambulatory Visit: Payer: Medicare Other | Admitting: Family Medicine
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
## 2022-11-12 ENCOUNTER — Ambulatory Visit: Payer: Medicare Other | Admitting: Family Medicine

## 2022-11-12 ENCOUNTER — Encounter: Payer: Self-pay | Admitting: Family Medicine

## 2022-11-12 VITALS — BP 128/82 | HR 73 | Temp 98.2°F | Ht 62.0 in | Wt 168.8 lb

## 2022-11-12 DIAGNOSIS — Z23 Encounter for immunization: Secondary | ICD-10-CM | POA: Diagnosis not present

## 2022-11-12 DIAGNOSIS — D649 Anemia, unspecified: Secondary | ICD-10-CM

## 2022-11-12 DIAGNOSIS — I10 Essential (primary) hypertension: Secondary | ICD-10-CM

## 2022-11-12 DIAGNOSIS — N183 Chronic kidney disease, stage 3 unspecified: Secondary | ICD-10-CM | POA: Diagnosis not present

## 2022-11-12 NOTE — Progress Notes (Signed)
Subjective:    Patient ID: Gina Levine, female    DOB: 01/01/1951, 72 y.o.   MRN: [id number]  HPI Patient is a very sweet 72 year old African-American female who presents today for follow-up of her hypertension.  She is currently on amlodipine and hydrochlorothiazide.  Her recent labs in December showed a GFR of 48 indicating stage III chronic kidney disease.  She is not currently on an ACE inhibitor.  She had a urine protein creatinine ratio.  She denies any chest pain, shortness of breath, dyspnea on exertion.  Her blood pressure is excellent at 128/82.  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 Past Medical History:  Diagnosis Date   Anemia    Depression    Hypertension    Nephrolithiasis    Osteopenia 2017   T=-1.7   Past Surgical History:  Procedure Laterality Date   LITHOTRIPSY     x 2   Current Outpatient Medications on File Prior to Visit  Medication Sig Dispense Refill   amLODipine (NORVASC) 10 MG tablet Take 1 tablet (10 mg total) by mouth daily. 90 tablet 1   cholecalciferol (VITAMIN D) 1000 UNITS tablet Take 1,000 Units by mouth daily.     famotidine (PEPCID) 20 MG tablet Take 1 tablet (20 mg total) by mouth 2 (two) times daily. 180 tablet 1   ferrous sulfate 325 (65 FE) MG EC tablet Take 325 mg by mouth daily with breakfast.     hydrochlorothiazide (HYDRODIURIL) 25 MG tablet TAKE 1 TABLET (25 MG TOTAL) BY MOUTH DAILY. 90 tablet 0   omeprazole (PRILOSEC) 20 MG capsule TAKE 1 CAPSULE BY MOUTH EVERY DAY 90 capsule 3   No current facility-administered medications on file prior to visit.   Allergies  Allergen Reactions   Benazepril Other (See Comments)    "makes toes swell"    Social History   Socioeconomic History   Marital status: Married    Spouse name: Not on file   Number of children: 4   Years of education: Not on file   Highest education level: Not on file  Occupational History   Occupation: Retired  Tobacco  Use   Smoking status: Never   Smokeless tobacco: Never  Substance and Sexual Activity   Alcohol use: No   Drug use: No   Sexual activity: Not on file  Other Topics Concern   Not on file  Social History Narrative   Lost sister in [place] 2023.   20 grandchildren.   Social Determinants of Health   Financial Resource Strain: Not on file  Food Insecurity: Not on file  Transportation Needs: Not on file  Physical Activity: Not on file  Stress: Not on file  Social Connections: Not on file  Intimate Partner Violence: Not on file   Family History  Problem Relation Age of Onset   Cerebrovascular Accident Other       Review of Systems  All other systems reviewed and are negative.      Objective:   Physical Exam Vitals reviewed.  Constitutional:      General: She is not in acute distress.    Appearance: She is well-developed. She is not diaphoretic.  HENT:     Head: Normocephalic and atraumatic.     Right Ear: External ear normal.     Left Ear: External ear normal.     Nose: Nose normal.     Mouth/Throat:     Pharynx: No oropharyngeal exudate.  Eyes:     General: No scleral icterus.       Right eye: No discharge.        Left eye: No discharge.     Conjunctiva/sclera: Conjunctivae normal.     Pupils: Pupils are equal, round, and reactive to light.  Neck:     Thyroid: No thyromegaly.     Vascular: No JVD.     Trachea: No tracheal deviation.  Cardiovascular:     Rate and Rhythm: Normal rate and regular rhythm.     Heart sounds: Normal heart sounds. No murmur heard.    No friction rub. No gallop.  Pulmonary:     Effort: Pulmonary effort is normal. No respiratory distress.     Breath sounds: Normal breath sounds. No stridor. No wheezing or rales.  Chest:     Chest wall: No tenderness.  Abdominal:     General: Bowel sounds are normal. There is no distension.     Palpations: Abdomen is soft. There is no mass.     Tenderness: There is no abdominal tenderness. There is no  guarding or rebound.  Musculoskeletal:        General: No tenderness or deformity.     Cervical back: Normal range of motion and neck supple.     Right knee: Effusion and crepitus present. Decreased range of motion.     Left knee: No crepitus. Decreased range of motion.  Lymphadenopathy:     Cervical: No cervical adenopathy.  Skin:    General: Skin is warm.     Coloration: Skin is not pale.     Findings: No erythema or rash.  Neurological:     Mental Status: She is alert and oriented to person, place, and time.     Cranial Nerves: No cranial nerve deficit.     Motor: No abnormal muscle tone.     Coordination: Coordination normal.     Deep Tendon Reflexes: Reflexes are normal and symmetric.  Psychiatric:        Behavior: Behavior normal.        Thought Content: Thought content normal.        Judgment: Judgment normal.           Assessment & Plan:  Benign essential HTN - Plan: COMPLETE METABOLIC PANEL WITH GFR, Lipid panel, Protein / Creatinine Ratio, Urine, CBC with Differential/Platelet  Stage 3 chronic kidney disease, unspecified whether stage 3a or 3b CKD (HCC) - Plan: COMPLETE METABOLIC PANEL WITH GFR, Lipid panel, Protein / Creatinine Ratio, Urine, CBC with Differential/Platelet  Anemia, unspecified type - Plan: COMPLETE METABOLIC PANEL WITH GFR, Lipid panel, Protein / Creatinine Ratio, Urine, CBC with Differential/Platelet Blood pressure today is outstanding.  Continue to remind the patient to stay away from NSAIDs.  Check urine protein creatinine ratio.  If elevated, I will discontinue hydrochlorothiazide and replace with an angiotensin receptor blocker for renal protection.  I would like to see her LDL cholesterol below 100.  The patient received her flu shot today.

## 2022-11-13 ENCOUNTER — Other Ambulatory Visit: Payer: Self-pay

## 2022-11-13 DIAGNOSIS — I10 Essential (primary) hypertension: Secondary | ICD-10-CM

## 2022-11-13 DIAGNOSIS — N183 Chronic kidney disease, stage 3 unspecified: Secondary | ICD-10-CM

## 2022-11-13 LAB — CBC WITH DIFFERENTIAL/PLATELET
Absolute Monocytes: 421 {cells}/uL (ref 200–950)
Basophils Absolute: 28 {cells}/uL (ref 0–200)
Basophils Relative: 0.4 %
Eosinophils Absolute: 311 {cells}/uL (ref 15–500)
Eosinophils Relative: 4.5 %
HCT: 33.4 % — ABNORMAL LOW (ref 35.0–45.0)
Hemoglobin: 10.5 g/dL — ABNORMAL LOW (ref 11.7–15.5)
Lymphs Abs: 2443 {cells}/uL (ref 850–3900)
MCH: 30 pg (ref 27.0–33.0)
MCHC: 31.4 g/dL — ABNORMAL LOW (ref 32.0–36.0)
MCV: 95.4 fL (ref 80.0–100.0)
MPV: 11.9 fL (ref 7.5–12.5)
Monocytes Relative: 6.1 %
Neutro Abs: 3698 {cells}/uL (ref 1500–7800)
Neutrophils Relative %: 53.6 %
Platelets: 290 10*3/uL (ref 140–400)
RBC: 3.5 10*6/uL — ABNORMAL LOW (ref 3.80–5.10)
RDW: 11.8 % (ref 11.0–15.0)
Total Lymphocyte: 35.4 %
WBC: 6.9 10*3/uL (ref 3.8–10.8)

## 2022-11-13 LAB — LIPID PANEL
Cholesterol: 202 mg/dL — ABNORMAL HIGH (ref <200)
HDL: 48 mg/dL — ABNORMAL LOW (ref >=50)
LDL Cholesterol (Calc): 126 mg/dL — ABNORMAL HIGH
Non-HDL Cholesterol (Calc): 154 mg/dL — ABNORMAL HIGH (ref <130)
Total CHOL/HDL Ratio: 4.2 (calc) (ref <5.0)
Triglycerides: 164 mg/dL — ABNORMAL HIGH (ref <150)

## 2022-11-13 LAB — COMPLETE METABOLIC PANEL WITH GFR
AG Ratio: 1.6 (calc) (ref 1.0–2.5)
ALT: 11 U/L (ref 6–29)
AST: 14 U/L (ref 10–35)
Albumin: 4.6 g/dL (ref 3.6–5.1)
Alkaline phosphatase (APISO): 69 U/L (ref 37–153)
BUN/Creatinine Ratio: 19 (calc) (ref 6–22)
BUN: 30 mg/dL — ABNORMAL HIGH (ref 7–25)
CO2: 24 mmol/L (ref 20–32)
Calcium: 9.6 mg/dL (ref 8.6–10.4)
Chloride: 107 mmol/L (ref 98–110)
Creat: 1.61 mg/dL — ABNORMAL HIGH (ref 0.60–1.00)
Globulin: 2.9 g/dL (ref 1.9–3.7)
Glucose, Bld: 111 mg/dL — ABNORMAL HIGH (ref 65–99)
Potassium: 4.2 mmol/L (ref 3.5–5.3)
Sodium: 140 mmol/L (ref 135–146)
Total Bilirubin: 0.6 mg/dL (ref 0.2–1.2)
Total Protein: 7.5 g/dL (ref 6.1–8.1)
eGFR: 34 mL/min/{1.73_m2} — ABNORMAL LOW (ref >=60)

## 2022-11-13 LAB — PROTEIN / CREATININE RATIO, URINE
Creatinine, Urine: 168 mg/dL (ref 20–275)
Protein/Creat Ratio: 101 mg/g{creat} (ref 24–184)
Protein/Creatinine Ratio: 0.101 mg/mg{creat} (ref 0.024–0.184)
Total Protein, Urine: 17 mg/dL (ref 5–24)

## 2022-11-13 MED ORDER — LOSARTAN POTASSIUM 50 MG PO TABS
50.0000 mg | ORAL_TABLET | Freq: Every day | ORAL | 1 refills | Status: DC
Start: 2022-11-13 — End: 2022-12-13

## 2022-11-27 ENCOUNTER — Telehealth: Payer: Self-pay

## 2022-11-27 NOTE — Telephone Encounter (Signed)
Pt called in wanting to speak with nurse about a med. Pt stated that pcp refilled a med for her while she was out of town, pt would like to know if she needs to continue taking that med? Please advise.  Cb#: [phone]

## 2022-12-11 ENCOUNTER — Ambulatory Visit: Payer: Medicare Other

## 2022-12-11 DIAGNOSIS — N183 Chronic kidney disease, stage 3 unspecified: Secondary | ICD-10-CM

## 2022-12-11 DIAGNOSIS — I10 Essential (primary) hypertension: Secondary | ICD-10-CM

## 2022-12-11 NOTE — Progress Notes (Signed)
Here for labs only

## 2022-12-12 LAB — BASIC METABOLIC PANEL
BUN/Creatinine Ratio: 16 (calc) (ref 6–22)
BUN: 20 mg/dL (ref 7–25)
CO2: 26 mmol/L (ref 20–32)
Calcium: 9.9 mg/dL (ref 8.6–10.4)
Chloride: 103 mmol/L (ref 98–110)
Creat: 1.28 mg/dL — ABNORMAL HIGH (ref 0.60–1.00)
Glucose, Bld: 89 mg/dL (ref 65–99)
Potassium: 4.4 mmol/L (ref 3.5–5.3)
Sodium: 139 mmol/L (ref 135–146)

## 2022-12-13 ENCOUNTER — Telehealth: Payer: Self-pay

## 2022-12-13 ENCOUNTER — Other Ambulatory Visit: Payer: Self-pay

## 2022-12-13 DIAGNOSIS — N183 Chronic kidney disease, stage 3 unspecified: Secondary | ICD-10-CM

## 2022-12-13 DIAGNOSIS — I10 Essential (primary) hypertension: Secondary | ICD-10-CM

## 2022-12-13 MED ORDER — LOSARTAN POTASSIUM 50 MG PO TABS: 50.0000 mg | ORAL_TABLET | Freq: Every day | ORAL | 1 refills | Status: DC

## 2022-12-13 NOTE — Telephone Encounter (Signed)
Pt called in stating that she needs this med resent to pharmacy in [place]. Pt stated she was out of town and had this med sent to where she was and is now out of this med.Please advise.  losartan (COZAAR) 50 MG tablet [[id number]]  PHARMACY: [id number] [place], [place] - [address] [place] [address] Phone: [phone]  Fax: [phone] DEA #: [id number]  CB#: [id number]

## 2022-12-27 ENCOUNTER — Encounter: Payer: Self-pay | Admitting: Internal Medicine

## 2023-01-23 ENCOUNTER — Encounter: Payer: Self-pay | Admitting: Internal Medicine

## 2023-02-01 ENCOUNTER — Encounter: Payer: Medicare Other | Admitting: Family Medicine

## 2023-03-11 ENCOUNTER — Ambulatory Visit (AMBULATORY_SURGERY_CENTER): Payer: Medicare Other

## 2023-03-11 ENCOUNTER — Telehealth: Payer: Self-pay

## 2023-03-11 VITALS — Ht 62.0 in | Wt 164.0 lb

## 2023-03-11 DIAGNOSIS — Z1211 Encounter for screening for malignant neoplasm of colon: Secondary | ICD-10-CM

## 2023-03-11 MED ORDER — SUFLAVE 178.7 G PO SOLR
1.0000 | ORAL | 0 refills | Status: DC
Start: 2023-03-11 — End: 2023-04-10

## 2023-03-11 NOTE — Telephone Encounter (Signed)
PV progress.

## 2023-03-11 NOTE — Progress Notes (Signed)
No egg or soy allergy known to patient  No issues known to pt with past sedation with any surgeries or procedures Patient denies ever being told they had issues or difficulty with intubation  No FH of Malignant Hyperthermia Pt is not on diet pills Pt is not on  home 02  Pt is not on blood thinners  Pt denies issues with constipation  No A fib or A flutter Have any cardiac testing pending-- no  LOA: independent  Prep: suflave   Patient's chart reviewed by Cathlyn Parsons CNRA prior to previsit and patient appropriate for the [facility].  Previsit completed and red dot placed by patient's name on their procedure day (on provider's schedule).     PV completed with patient. Prep instructions sent to home address.

## 2023-04-05 ENCOUNTER — Encounter: Payer: Self-pay | Admitting: Internal Medicine

## 2023-04-10 ENCOUNTER — Ambulatory Visit: Payer: Medicare Other | Admitting: Internal Medicine

## 2023-04-10 ENCOUNTER — Encounter: Payer: Self-pay | Admitting: Internal Medicine

## 2023-04-10 VITALS — BP 141/91 | HR 59 | Temp 97.9°F | Resp 11 | Ht 62.0 in | Wt 164.0 lb

## 2023-04-10 DIAGNOSIS — D122 Benign neoplasm of ascending colon: Secondary | ICD-10-CM | POA: Diagnosis not present

## 2023-04-10 DIAGNOSIS — D120 Benign neoplasm of cecum: Secondary | ICD-10-CM

## 2023-04-10 DIAGNOSIS — K648 Other hemorrhoids: Secondary | ICD-10-CM

## 2023-04-10 DIAGNOSIS — K5730 Diverticulosis of large intestine without perforation or abscess without bleeding: Secondary | ICD-10-CM

## 2023-04-10 DIAGNOSIS — Z1211 Encounter for screening for malignant neoplasm of colon: Secondary | ICD-10-CM

## 2023-04-10 MED ORDER — SODIUM CHLORIDE 0.9 % IV SOLN: 500.0000 mL | INTRAVENOUS | Status: AC

## 2023-04-10 NOTE — Progress Notes (Signed)
 To pacu, VSS. Report to [email or web address]

## 2023-04-10 NOTE — Op Note (Signed)
 [facility] Patient Name: Gina Levine Procedure Date: 04/10/2023 9:04 AM MRN: [id number] Endoscopist: Beverley Fiedler , MD, [id number] Age: 73 Referring MD:  Date of Birth: [birth year] Gender: Female Account #: [id number] Procedure:                Colonoscopy Indications:              Screening for colorectal malignant neoplasm, Last                            colonoscopy: 2014 Medicines:                Monitored Anesthesia Care Procedure:                Pre-Anesthesia Assessment:                           - Prior to the procedure, a History and Physical                            was performed, and patient medications and                            allergies were reviewed. The patient's tolerance of                            previous anesthesia was also reviewed. The risks                            and benefits of the procedure and the sedation                            options and risks were discussed with the patient.                            All questions were answered, and informed consent                            was obtained. Prior Anticoagulants: The patient has                            taken no anticoagulant or antiplatelet agents. ASA                            Grade Assessment: II - A patient with mild systemic                            disease. After reviewing the risks and benefits,                            the patient was deemed in satisfactory condition to                            undergo the procedure.  After obtaining informed consent, the colonoscope                            was passed under direct vision. Throughout the                            procedure, the patient's blood pressure, pulse, and                            oxygen saturations were monitored continuously. The                            Olympus Scope SN [id number] was introduced through the                            anus and advanced to the cecum,  identified by                            appendiceal orifice and ileocecal valve. The                            colonoscopy was performed without difficulty. The                            patient tolerated the procedure well. The quality                            of the bowel preparation was good. The ileocecal                            valve, appendiceal orifice, and rectum were                            photographed. Scope In: 9:16:17 AM Scope Out: 9:27:22 AM Scope Withdrawal Time: 0 hours 8 minutes 24 seconds  Total Procedure Duration: 0 hours 11 minutes 5 seconds  Findings:                 The digital rectal exam was normal.                           A 3 mm polyp was found in the cecum. The polyp was                            sessile. The polyp was removed with a cold snare.                            Resection and retrieval were complete.                           A 4 mm polyp was found in the ascending colon. The                            polyp was sessile. The polyp  was removed with a                            cold snare. Resection and retrieval were complete.                           A few small-mouthed diverticula were found in the                            proximal transverse colon and hepatic flexure.                           Internal hemorrhoids were found during                            retroflexion. The hemorrhoids were small. Complications:            No immediate complications. Estimated Blood Loss:     Estimated blood loss: none. Impression:               - One 3 mm polyp in the cecum, removed with a cold                            snare. Resected and retrieved.                           - One 4 mm polyp in the ascending colon, removed                            with a cold snare. Resected and retrieved.                           - Mild diverticulosis in the proximal transverse                            colon and at the hepatic flexure.                            - Small internal hemorrhoids. Recommendation:           - Patient has a contact number available for                            emergencies. The signs and symptoms of potential                            delayed complications were discussed with the                            patient. Return to normal activities tomorrow.                            Written discharge instructions were provided to the                            patient.                           -  Resume previous diet.                           - Continue present medications.                           - Await pathology results.                           - Repeat colonoscopy is recommended. The                            colonoscopy date will be determined after pathology                            results from today's exam become available for                            review. Beverley Fiedler, MD 04/10/2023 9:30:05 AM This report has been signed electronically.

## 2023-04-10 NOTE — Progress Notes (Signed)
 Pt's states no medical or surgical changes since previsit or office visit.

## 2023-04-10 NOTE — Progress Notes (Signed)
 GASTROENTEROLOGY PROCEDURE H&P NOTE   Primary Care Physician: Donita Brooks, MD    Reason for Procedure:  Colon cancer screening  Plan:    Colonoscopy  Patient is appropriate for endoscopic procedure(s) in the ambulatory ([facility]) setting.  The nature of the procedure, as well as the risks, benefits, and alternatives were carefully and thoroughly reviewed with the patient. Ample time for discussion and questions allowed. The patient understood, was satisfied, and agreed to proceed.     HPI: Gina Levine is a 73 y.o. female who presents for screening colonoscopy.  Medical history as below.  Tolerated the prep.  No recent chest pain or shortness of breath.  No abdominal pain today.  Past Medical History:  Diagnosis Date   Anemia    Depression    Hypertension    Nephrolithiasis    Osteopenia 2017   T=-1.7    Past Surgical History:  Procedure Laterality Date   LITHOTRIPSY     x 2    Prior to Admission medications   Medication Sig Start Date End Date Taking? Authorizing Provider  amLODipine (NORVASC) 10 MG tablet Take 1 tablet (10 mg total) by mouth daily. 03/08/22  Yes Donita Brooks, MD  ferrous sulfate 325 (65 FE) MG EC tablet Take 325 mg by mouth daily with breakfast.   Yes [provider]  hydrochlorothiazide (HYDRODIURIL) 25 MG tablet TAKE 1 TABLET (25 MG TOTAL) BY MOUTH DAILY. 04/04/23  Yes Donita Brooks, MD  losartan (COZAAR) 50 MG tablet Take 1 tablet (50 mg total) by mouth daily. 12/13/22  Yes Donita Brooks, MD  cholecalciferol (VITAMIN D) 1000 UNITS tablet Take 1,000 Units by mouth daily. Patient not taking: Reported on 04/10/2023    [provider]  famotidine (PEPCID) 20 MG tablet Take 1 tablet (20 mg total) by mouth 2 (two) times daily. Patient not taking: Reported on 04/10/2023 08/14/22   Donita Brooks, MD  omeprazole (PRILOSEC) 20 MG capsule TAKE 1 CAPSULE BY MOUTH EVERY DAY Patient not taking: Reported on 04/10/2023  05/10/22   Donita Brooks, MD    Current Outpatient Medications  Medication Sig Dispense Refill   amLODipine (NORVASC) 10 MG tablet Take 1 tablet (10 mg total) by mouth daily. 90 tablet 1   ferrous sulfate 325 (65 FE) MG EC tablet Take 325 mg by mouth daily with breakfast.     hydrochlorothiazide (HYDRODIURIL) 25 MG tablet TAKE 1 TABLET (25 MG TOTAL) BY MOUTH DAILY. 90 tablet 0   losartan (COZAAR) 50 MG tablet Take 1 tablet (50 mg total) by mouth daily. 90 tablet 1   cholecalciferol (VITAMIN D) 1000 UNITS tablet Take 1,000 Units by mouth daily. (Patient not taking: Reported on 04/10/2023)     famotidine (PEPCID) 20 MG tablet Take 1 tablet (20 mg total) by mouth 2 (two) times daily. (Patient not taking: Reported on 04/10/2023) 180 tablet 1   omeprazole (PRILOSEC) 20 MG capsule TAKE 1 CAPSULE BY MOUTH EVERY DAY (Patient not taking: Reported on 04/10/2023) 90 capsule 3   Current Facility-Administered Medications  Medication Dose Route Frequency Provider Last Rate Last Admin   0.9 %  sodium chloride infusion  500 mL Intravenous Continuous Alwilda Gilland, Carie Caddy, MD        Allergies as of 04/10/2023 - Review Complete 04/10/2023  Allergen Reaction Noted   Benazepril Other (See Comments) 09/06/2010    Family History  Problem Relation Age of Onset   Cerebrovascular Accident Other    Colon cancer  Neg Hx    Rectal cancer Neg Hx    Stomach cancer Neg Hx     Social History   Socioeconomic History   Marital status: Married    Spouse name: Not on file   Number of children: 4   Years of education: Not on file   Highest education level: Not on file  Occupational History   Occupation: Retired  Tobacco Use   Smoking status: Never   Smokeless tobacco: Never  Substance and Sexual Activity   Alcohol use: No   Drug use: No   Sexual activity: Not on file  Other Topics Concern   Not on file  Social History Narrative   Lost sister in [place] 2023.   20 grandchildren.   Social Drivers of Manufacturing engineer Strain: Not on file  Food Insecurity: Not on file  Transportation Needs: Not on file  Physical Activity: Not on file  Stress: Not on file  Social Connections: Not on file  Intimate Partner Violence: Not on file    Physical Exam: Vital signs in last 24 hours: @BP  (!) 150/98 (Cuff Size: Normal)   Pulse 67   Temp 97.9 F (36.6 C) (Temporal)   Ht 5\' 2"  (1.575 m)   Wt 164 lb (74.4 kg)   SpO2 98%   BMI 30.00 kg/m  GEN: NAD EYE: Sclerae anicteric ENT: MMM CV: Non-tachycardic Pulm: CTA b/l GI: Soft, NT/ND NEURO:  Alert & Oriented x 3   Erick Blinks, MD [facility]  04/10/2023 9:05 AM

## 2023-04-10 NOTE — Progress Notes (Signed)
 Called to room to assist during endoscopic procedure.  Patient ID and intended procedure confirmed with present staff. Received instructions for my participation in the procedure from the performing physician.

## 2023-04-10 NOTE — Patient Instructions (Addendum)
 Await pathology results. Repeat colonoscopy is recommended. The colonoscopy date will be determined after pathology results from today's exam become available for  review.  Please read over handouts provided   YOU HAD AN ENDOSCOPIC PROCEDURE TODAY AT THE [facility]:   Refer to the procedure report that was given to you for any specific questions about what was found during the examination.  If the procedure report does not answer your questions, please call your gastroenterologist to clarify.  If you requested that your care partner not be given the details of your procedure findings, then the procedure report has been included in a sealed envelope for you to review at your convenience later.  YOU SHOULD EXPECT: Some feelings of bloating in the abdomen. Passage of more gas than usual.  Walking can help get rid of the air that was put into your GI tract during the procedure and reduce the bloating. If you had a lower endoscopy (such as a colonoscopy or flexible sigmoidoscopy) you may notice spotting of blood in your stool or on the toilet paper. If you underwent a bowel prep for your procedure, you may not have a normal bowel movement for a few days.  Please Note:  You might notice some irritation and congestion in your nose or some drainage.  This is from the oxygen used during your procedure.  There is no need for concern and it should clear up in a day or so.  SYMPTOMS TO REPORT IMMEDIATELY:  Following lower endoscopy (colonoscopy or flexible sigmoidoscopy):  Excessive amounts of blood in the stool  Significant tenderness or worsening of abdominal pains  Swelling of the abdomen that is new, acute  Fever of 100F or higher  For urgent or emergent issues, a gastroenterologist can be reached at any hour by calling [phone]. Do not use MyChart messaging for urgent concerns.    DIET:  We do recommend a small meal at first, but then you may proceed to your regular diet.   Drink plenty of fluids but you should avoid alcoholic beverages for 24 hours.  ACTIVITY:  You should plan to take it easy for the rest of today and you should NOT DRIVE or use heavy machinery until tomorrow (because of the sedation medicines used during the test).    FOLLOW UP: Our staff will call the number listed on your records the next business day following your procedure.  We will call around 7:15- 8:00 am to check on you and address any questions or concerns that you may have regarding the information given to you following your procedure. If we do not reach you, we will leave a message.     If any biopsies were taken you will be contacted by phone or by letter within the next 1-3 weeks.  Please call us at [phone] if you have not heard about the biopsies in 3 weeks.    SIGNATURES/CONFIDENTIALITY: You and/or your care partner have signed paperwork which will be entered into your electronic medical record.  These signatures attest to the fact that that the information above on your After Visit Summary has been reviewed and is understood.  Full responsibility of the confidentiality of this discharge information lies with you and/or your care-partner.

## 2023-04-11 ENCOUNTER — Telehealth: Payer: Self-pay

## 2023-04-11 NOTE — Telephone Encounter (Signed)
  Follow up Call-     04/10/2023    8:13 AM  Call back number  Post procedure Call Back phone  # [phone]  Permission to leave phone message Yes     Patient questions:  Do you have a fever, pain , or abdominal swelling? No. Pain Score  0 *  Have you tolerated food without any problems? Yes.    Have you been able to return to your normal activities? Yes.    Do you have any questions about your discharge instructions: Diet   No. Medications  No. Follow up visit  No.  Do you have questions or concerns about your Care? No.  Actions: * If pain score is 4 or above: No action needed, pain <4.

## 2023-04-12 LAB — SURGICAL PATHOLOGY

## 2023-04-15 ENCOUNTER — Encounter: Payer: Self-pay | Admitting: Internal Medicine

## 2023-05-07 ENCOUNTER — Other Ambulatory Visit: Payer: Self-pay | Admitting: Family Medicine

## 2023-05-07 DIAGNOSIS — I10 Essential (primary) hypertension: Secondary | ICD-10-CM

## 2023-05-07 DIAGNOSIS — N183 Chronic kidney disease, stage 3 unspecified: Secondary | ICD-10-CM

## 2023-05-08 NOTE — Telephone Encounter (Signed)
 Rx 12/13/22 #90 1RF- too soon OV 11/12/22- needs visit Requested Prescriptions  Pending Prescriptions Disp Refills   losartan (COZAAR) 50 MG tablet [Pharmacy Med Name: LOSARTAN POTASSIUM 50 MG TAB] 90 tablet 1    Sig: TAKE 1 TABLET BY MOUTH EVERY DAY     Cardiovascular:  Angiotensin Receptor Blockers Failed - 05/08/2023 12:14 PM      Failed - Cr in normal range and within 180 days    Creat  Date Value Ref Range Status  12/11/2022 1.28 (H) 0.60 - 1.00 mg/dL Final   Creatinine, Urine  Date Value Ref Range Status  11/12/2022 168 20 - 275 mg/dL Final         Failed - Last BP in normal range    BP Readings from Last 1 Encounters:  04/10/23 (!) 141/91         Failed - Valid encounter within last 6 months    Recent Outpatient Visits           2 years ago Acute pain of right knee   [facility] Donita Brooks, MD   2 years ago Benign essential HTN   [facility] Tanya Nones, Priscille Heidelberg, MD   3 years ago Anemia, unspecified type   [facility] Pickard, Priscille Heidelberg, MD   4 years ago General medical exam   [facility] Donita Brooks, MD   6 years ago Benign essential HTN   [facility] Pickard, Priscille Heidelberg, MD              Passed - K in normal range and within 180 days    Potassium  Date Value Ref Range Status  12/11/2022 4.4 3.5 - 5.3 mmol/L Final         Passed - Patient is not pregnant

## 2023-05-13 ENCOUNTER — Telehealth: Payer: Self-pay | Admitting: Family Medicine

## 2023-05-13 ENCOUNTER — Other Ambulatory Visit: Payer: Self-pay | Admitting: Family Medicine

## 2023-05-13 NOTE — Telephone Encounter (Signed)
 Prescription Request  05/13/2023  LOV: 11/12/2022  What is the name of the medication or equipment?   famotidine (PEPCID) 20 MG tablet   Have you contacted your pharmacy to request a refill? Yes   Which pharmacy would you like this sent to?  [id number] [place], [place] - [address] [place] [address] Phone: [phone] Fax: [phone]    Patient notified that their request is being sent to the clinical staff for review and that they should receive a response within 2 business days.   Please advise pharmacist.

## 2023-05-14 NOTE — Telephone Encounter (Signed)
 OV 11/12/22 Requested Prescriptions  Pending Prescriptions Disp Refills   omeprazole (PRILOSEC) 20 MG capsule [Pharmacy Med Name: OMEPRAZOLE DR 20 MG CAPSULE] 90 capsule 3    Sig: TAKE 1 CAPSULE BY MOUTH EVERY DAY     Gastroenterology: Proton Pump Inhibitors Failed - 05/14/2023  4:23 PM      Failed - Valid encounter within last 12 months    Recent Outpatient Visits           6 months ago Benign essential HTN   [facility] Pickard, Priscille Heidelberg, MD   1 year ago Osteopenia, unspecified location   [facility] Pickard, Priscille Heidelberg, MD

## 2023-06-20 ENCOUNTER — Other Ambulatory Visit: Payer: Self-pay | Admitting: Family Medicine

## 2023-06-20 DIAGNOSIS — I10 Essential (primary) hypertension: Secondary | ICD-10-CM

## 2023-06-20 DIAGNOSIS — N183 Chronic kidney disease, stage 3 unspecified: Secondary | ICD-10-CM

## 2023-06-20 NOTE — Telephone Encounter (Unsigned)
 Copied from CRM [id number]. Topic: Clinical - Medication Refill >> Jun 20, 2023  9:20 AM Hobson Luna F wrote: Medication: losartan  (COZAAR ) 50 MG tablet [[id number]]  Has the patient contacted their pharmacy? Yes  (Agent: If yes, when and what did the pharmacy advise?) Contact office   This is the patient's preferred pharmacy:  [id number] [place], [place] - [address] [place] [address] Phone: [phone] Fax: [phone]  Is this the correct pharmacy for this prescription? Yes If no, delete pharmacy and type the correct one.   Has the prescription been filled recently? Yes  Is the patient out of the medication? Yes  Has the patient been seen for an appointment in the last year OR does the patient have an upcoming appointment? Yes  Can we respond through MyChart? Yes  Agent: Please be advised that Rx refills may take up to 3 business days. We ask that you follow-up with your pharmacy.

## 2023-06-20 NOTE — Telephone Encounter (Signed)
 Prescription Request  06/20/2023  LOV: 11/12/2022  What is the name of the medication or equipment? famotidine  (PEPCID ) 20 MG tablet [[id number]]    Have you contacted your pharmacy to request a refill? Yes   Which pharmacy would you like this sent to?  [id number] [place], [place] - [address] [place] [address] Phone: [phone] Fax: [phone]    Patient notified that their request is being sent to the clinical staff for review and that they should receive a response within 2 business days.   Please advise at [facility] [phone]

## 2023-06-21 MED ORDER — FAMOTIDINE 20 MG PO TABS: 20.0000 mg | ORAL_TABLET | Freq: Two times a day (BID) | ORAL | 0 refills | Status: DC

## 2023-06-21 NOTE — Telephone Encounter (Signed)
 Requested Prescriptions  Pending Prescriptions Disp Refills   famotidine  (PEPCID ) 20 MG tablet 180 tablet 0    Sig: Take 1 tablet (20 mg total) by mouth 2 (two) times daily.     Gastroenterology:  H2 Antagonists Failed - 06/21/2023  5:03 PM      Failed - Valid encounter within last 12 months    Recent Outpatient Visits           7 months ago Benign essential HTN   [facility] Pickard, Cisco Crest, MD   1 year ago Osteopenia, unspecified location    [facility] Pickard, Cisco Crest, MD

## 2023-07-18 ENCOUNTER — Ambulatory Visit: Payer: Medicare Other | Admitting: *Deleted

## 2023-07-18 DIAGNOSIS — Z0000 Encounter for general adult medical examination without abnormal findings: Secondary | ICD-10-CM | POA: Diagnosis not present

## 2023-07-18 NOTE — Progress Notes (Signed)
 Subjective:   Gina Levine is a 73 y.o. female who presents for Medicare Annual (Subsequent) preventive examination.  Visit Complete: Virtual I connected with  Gina Levine on 07/18/23 by a audio enabled telemedicine application and verified that I am speaking with the correct person using two identifiers.  Patient Location: Home  Provider Location: Home Office  I discussed the limitations of evaluation and management by telemedicine. The patient expressed understanding and agreed to proceed.  Vital Signs: Because this visit was a virtual/telehealth visit, some criteria may be missing or patient reported. Any vitals not documented were not able to be obtained and vitals that have been documented are patient reported.    Cardiac Risk Factors include: advanced age (>56men, >13 women);hypertension     Objective:     There were no vitals filed for this visit. There is no height or weight on file to calculate BMI.     07/18/2023    3:32 PM 09/22/2019    8:24 AM 09/19/2014    2:50 PM 09/10/2014   11:43 PM  Advanced Directives  Does Patient Have a Medical Advance Directive? No No No No  Would patient like information on creating a medical advance directive? No - Patient declined       Current Medications (verified) Outpatient Encounter Medications as of 07/18/2023  Medication Sig   amLODipine  (NORVASC ) 10 MG tablet Take 1 tablet (10 mg total) by mouth daily.   famotidine  (PEPCID ) 20 MG tablet Take 1 tablet (20 mg total) by mouth 2 (two) times daily.   ferrous sulfate 325 (65 FE) MG EC tablet Take 325 mg by mouth daily with breakfast.   hydrochlorothiazide  (HYDRODIURIL ) 25 MG tablet TAKE 1 TABLET (25 MG TOTAL) BY MOUTH DAILY.   losartan  (COZAAR ) 50 MG tablet TAKE 1 TABLET BY MOUTH EVERY DAY   omeprazole  (PRILOSEC) 20 MG capsule TAKE 1 CAPSULE BY MOUTH EVERY DAY   cholecalciferol (VITAMIN D ) 1000 UNITS tablet Take 1,000 Units by mouth daily. (Patient not taking: Reported on  04/10/2023)   No facility-administered encounter medications on file as of 07/18/2023.    Allergies (verified) Benazepril   History: Past Medical History:  Diagnosis Date   Anemia    Depression    Hypertension    Nephrolithiasis    Osteopenia 2017   T=-1.7   Past Surgical History:  Procedure Laterality Date   LITHOTRIPSY     x 2   Family History  Problem Relation Age of Onset   Cerebrovascular Accident Other    Colon cancer Neg Hx    Rectal cancer Neg Hx    Stomach cancer Neg Hx    Social History   Socioeconomic History   Marital status: Widowed    Spouse name: Not on file   Number of children: 4   Years of education: Not on file   Highest education level: Not on file  Occupational History   Occupation: Retired  Tobacco Use   Smoking status: Never   Smokeless tobacco: Never  Substance and Sexual Activity   Alcohol use: No   Drug use: No   Sexual activity: Not Currently  Other Topics Concern   Not on file  Social History Narrative   Lost sister in [place] 2023.   20 grandchildren.   Social Drivers of Corporate investment banker Strain: Low Risk  (07/18/2023)   Overall Financial Resource Strain (CARDIA)    Difficulty of Paying Living Expenses: Not hard at all  Food Insecurity: No  Food Insecurity (07/18/2023)   Hunger Vital Sign    Worried About Running Out of Food in the Last Year: Never true    Ran Out of Food in the Last Year: Never true  Transportation Needs: No Transportation Needs (07/18/2023)   PRAPARE - Administrator, Civil Service (Medical): No    Lack of Transportation (Non-Medical): No  Physical Activity: Sufficiently Active (07/18/2023)   Exercise Vital Sign    Days of Exercise per Week: 4 days    Minutes of Exercise per Session: 40 min  Stress: No Stress Concern Present (07/18/2023)   [employer] of Occupational Health - Occupational Stress Questionnaire    Feeling of Stress : Not at all  Social Connections: Unknown (07/18/2023)    Social Connection and Isolation Panel [NHANES]    Frequency of Communication with Friends and Family: More than three times a week    Frequency of Social Gatherings with Friends and Family: More than three times a week    Attends Religious Services: Patient unable to answer    Active Member of Clubs or Organizations: Not on file    Attends Banker Meetings: More than 4 times per year    Marital Status: Widowed    Tobacco Counseling Counseling given: Not Answered   Clinical Intake:  Pre-visit preparation completed: Yes  Pain : No/denies pain     Diabetes: No  How often do you need to have someone help you when you read instructions, pamphlets, or other written materials from your doctor or pharmacy?: 1 - Never  Interpreter Needed?: No  Information entered by :: Kieth Pelt LPN   Activities of Daily Living    07/18/2023    3:31 PM  In your present state of health, do you have any difficulty performing the following activities:  Hearing? 0  Vision? 0  Difficulty concentrating or making decisions? 0  Walking or climbing stairs? 0  Dressing or bathing? 0  Doing errands, shopping? 0  Preparing Food and eating ? N  Using the Toilet? N  In the past six months, have you accidently leaked urine? N  Do you have problems with loss of bowel control? N  Managing your Medications? N  Managing your Finances? N  Housekeeping or managing your Housekeeping? N    Patient Care Team: Austine Lefort, MD as PCP - General (Family Medicine)  Indicate any recent Medical Services you may have received from other than [facility] providers in the past year (date may be approximate).     Assessment:    This is a routine wellness examination for Gina Levine.  Hearing/Vision screen Hearing Screening - Comments:: No trouble hearing Vision Screening - Comments:: [facility] Up to date   Goals Addressed             This Visit's Progress    Patient Stated        Eat healthier       Depression Screen    07/18/2023    3:28 PM 11/12/2022   10:48 AM 01/29/2022    8:26 AM 01/27/2021   10:29 AM 06/01/2020    3:21 PM 09/22/2019    8:25 AM 09/19/2018   10:21 AM  PHQ 2/9 Scores  PHQ - 2 Score 0 0 0 0 0 0 0  PHQ- 9 Score 0   0       Fall Risk    07/18/2023    3:30 PM 11/12/2022   10:48 AM 01/29/2022  8:26 AM 01/27/2021   10:29 AM 06/01/2020    3:21 PM  Fall Risk   Falls in the past year? 0 0 0 0 0  Number falls in past yr: 0 0 0 0   Injury with Fall? 0 0 0 0   Risk for fall due to :  No Fall Risks No Fall Risks  No Fall Risks  Follow up Falls evaluation completed;Education provided;Falls prevention discussed Falls prevention discussed Falls prevention discussed  Falls evaluation completed    MEDICARE RISK AT HOME: Medicare Risk at Home Any stairs in or around the home?: Yes If so, are there any without handrails?: No Home free of loose throw rugs in walkways, pet beds, electrical cords, etc?: Yes Adequate lighting in your home to reduce risk of falls?: Yes Life alert?: No Use of a cane, walker or w/c?: No Grab bars in the bathroom?: Yes Shower chair or bench in shower?: No Elevated toilet seat or a handicapped toilet?: Yes  TIMED UP AND GO:  Was the test performed?  No    Cognitive Function:        07/18/2023    3:29 PM 09/22/2019    8:27 AM  6CIT Screen  What Year? 0 points 0 points  What month? 0 points 0 points  What time? 0 points 0 points  Count back from 20 0 points 0 points  Months in reverse 0 points 0 points  Repeat phrase 2 points 0 points  Total Score 2 points 0 points    Immunizations Immunization History  Administered Date(s) Administered   Fluad Quad(high Dose 65+) 11/05/2018, 11/25/2019, 11/15/2020   Fluad Trivalent(High Dose 65+) 11/12/2022   Influenza,inj,Quad PF,6+ Mos 11/19/2012, 12/01/2013, 11/24/2015, 12/11/2016, 11/29/2017   Moderna Covid-19 Vaccine Bivalent Booster 69yrs & up 11/22/2020    Moderna Sars-Covid-2 Vaccination 03/18/2019, 04/15/2019, 12/22/2019, 05/31/2020   Pneumococcal Conjugate-13 04/14/2015   Pneumococcal Polysaccharide-23 03/19/2016   Respiratory Syncytial Virus Vaccine,Recomb Aduvanted(Arexvy) 10/17/2021   Tdap 11/19/2012   Zoster Recombinant(Shingrix) 02/24/2021, 08/04/2021    TDAP status: Due, Education has been provided regarding the importance of this vaccine. Advised may receive this vaccine at local pharmacy or Health Dept. Aware to provide a copy of the vaccination record if obtained from local pharmacy or Health Dept. Verbalized acceptance and understanding.  Flu Vaccine status: Up to date  Pneumococcal vaccine status: Up to date  Covid-19 vaccine status: Information provided on how to obtain vaccines.   Qualifies for Shingles Vaccine? No   Zostavax completed Yes   Shingrix Completed?: Yes  Screening Tests Health Maintenance  Topic Date Due   COVID-19 Vaccine (6 - 2024-25 season) 10/14/2022   DTaP/Tdap/Td (2 - Td or Tdap) 11/20/2022   INFLUENZA VACCINE  09/13/2023   MAMMOGRAM  06/26/2024   Medicare Annual Wellness (AWV)  07/17/2024   Pneumonia Vaccine 83+ Years old  Completed   DEXA SCAN  Completed   Hepatitis C Screening  Completed   Zoster Vaccines- Shingrix  Completed   HPV VACCINES  Aged Out   Meningococcal B Vaccine  Aged Out   Colonoscopy  Discontinued    Health Maintenance  Health Maintenance Due  Topic Date Due   COVID-19 Vaccine (6 - 2024-25 season) 10/14/2022   DTaP/Tdap/Td (2 - Td or Tdap) 11/20/2022    Colorectal cancer screening: No longer required.   Mammogram status: Completed  . Repeat every year  Bone Density status: Completed  . Results reflect: Bone density results: OSTEOPENIA. Repeat every 2 years.  Lung Cancer  Screening: (Low Dose CT Chest recommended if Age 32-80 years, 20 pack-year currently smoking OR have quit w/in 15years.) does not qualify.   Lung Cancer Screening Referral:   Additional  Screening:  Hepatitis C Screening: does not qualify; Completed 2019  Vision Screening: Recommended annual ophthalmology exams for early detection of glaucoma and other disorders of the eye. Is the patient up to date with their annual eye exam?  Yes  Who is the provider or what is the name of the office in which the patient attends annual eye exams?   [facility] If pt is not established with a provider, would they like to be referred to a provider to establish care? No .   Dental Screening: Recommended annual dental exams for proper oral hygiene    Community Resource Referral / Chronic Care Management: CRR required this visit?  No   CCM required this visit?  No     Plan:     I have personally reviewed and noted the following in the patient's chart:   Medical and social history Use of alcohol, tobacco or illicit drugs  Current medications and supplements including opioid prescriptions. Patient is not currently taking opioid prescriptions. Functional ability and status Nutritional status Physical activity Advanced directives List of other physicians Hospitalizations, surgeries, and ER visits in previous 12 months Vitals Screenings to include cognitive, depression, and falls Referrals and appointments  In addition, I have reviewed and discussed with patient certain preventive protocols, quality metrics, and best practice recommendations. A written personalized care plan for preventive services as well as general preventive health recommendations were provided to patient.     Kieth Pelt, LPN   07/21/6293   After Visit Summary: (MyChart) Due to this being a telephonic visit, the after visit summary with patients personalized plan was offered to patient via MyChart   Nurse Notes:

## 2023-07-18 NOTE — Patient Instructions (Signed)
 Gina Levine , Thank you for taking time to come for your Medicare Wellness Visit. I appreciate your ongoing commitment to your health goals. Please review the following plan we discussed and let me know if I can assist you in the future.   Screening recommendations/referrals: Colonoscopy: no longer required Mammogram: up to date Bone Density: up to date Recommended yearly ophthalmology/optometry visit for glaucoma screening and checkup Recommended yearly dental visit for hygiene and checkup  Vaccinations: Influenza vaccine: up to date Pneumococcal vaccine: up to date Tdap vaccine: Education provided         Preventive Care 65 Years and Older, Female Preventive care refers to lifestyle choices and visits with your health care provider that can promote health and wellness. What does preventive care include? A yearly physical exam. This is also called an annual well check. Dental exams once or twice a year. Routine eye exams. Ask your health care provider how often you should have your eyes checked. Personal lifestyle choices, including: Daily care of your teeth and gums. Regular physical activity. Eating a healthy diet. Avoiding tobacco and drug use. Limiting alcohol use. Practicing safe sex. Taking low-dose aspirin every day. Taking vitamin and mineral supplements as recommended by your health care provider. What happens during an annual well check? The services and screenings done by your health care provider during your annual well check will depend on your age, overall health, lifestyle risk factors, and family history of disease. Counseling  Your health care provider may ask you questions about your: Alcohol use. Tobacco use. Drug use. Emotional well-being. Home and relationship well-being. Sexual activity. Eating habits. History of falls. Memory and ability to understand (cognition). Work and work Astronomer. Reproductive health. Screening  You may have the  following tests or measurements: Height, weight, and BMI. Blood pressure. Lipid and cholesterol levels. These may be checked every 5 years, or more frequently if you are over 31 years old. Skin check. Lung cancer screening. You may have this screening every year starting at age 35 if you have a 30-pack-year history of smoking and currently smoke or have quit within the past 15 years. Fecal occult blood test (FOBT) of the stool. You may have this test every year starting at age 31. Flexible sigmoidoscopy or colonoscopy. You may have a sigmoidoscopy every 5 years or a colonoscopy every 10 years starting at age 51. Hepatitis C blood test. Hepatitis B blood test. Sexually transmitted disease (STD) testing. Diabetes screening. This is done by checking your blood sugar (glucose) after you have not eaten for a while (fasting). You may have this done every 1-3 years. Bone density scan. This is done to screen for osteoporosis. You may have this done starting at age 73. Mammogram. This may be done every 1-2 years. Talk to your health care provider about how often you should have regular mammograms. Talk with your health care provider about your test results, treatment options, and if necessary, the need for more tests. Vaccines  Your health care provider may recommend certain vaccines, such as: Influenza vaccine. This is recommended every year. Tetanus, diphtheria, and acellular pertussis (Tdap, Td) vaccine. You may need a Td booster every 10 years. Zoster vaccine. You may need this after age 66. Pneumococcal 13-valent conjugate (PCV13) vaccine. One dose is recommended after age 33. Pneumococcal polysaccharide (PPSV23) vaccine. One dose is recommended after age 31. Talk to your health care provider about which screenings and vaccines you need and how often you need them. This information is not  intended to replace advice given to you by your health care provider. Make sure you discuss any questions you  have with your health care provider. Document Released: 02/25/2015 Document Revised: 10/19/2015 Document Reviewed: 11/30/2014 Elsevier Interactive Patient Education  2017 [employer].  Fall Prevention in the Home Falls can cause injuries. They can happen to people of all ages. There are many things you can do to make your home safe and to help prevent falls. What can I do on the outside of my home? Regularly fix the edges of walkways and driveways and fix any cracks. Remove anything that might make you trip as you walk through a door, such as a raised step or threshold. Trim any bushes or trees on the path to your home. Use bright outdoor lighting. Clear any walking paths of anything that might make someone trip, such as rocks or tools. Regularly check to see if handrails are loose or broken. Make sure that both sides of any steps have handrails. Any raised decks and porches should have guardrails on the edges. Have any leaves, snow, or ice cleared regularly. Use sand or salt on walking paths during winter. Clean up any spills in your garage right away. This includes oil or grease spills. What can I do in the bathroom? Use night lights. Install grab bars by the toilet and in the tub and shower. Do not use towel bars as grab bars. Use non-skid mats or decals in the tub or shower. If you need to sit down in the shower, use a plastic, non-slip stool. Keep the floor dry. Clean up any water that spills on the floor as soon as it happens. Remove soap buildup in the tub or shower regularly. Attach bath mats securely with double-sided non-slip rug tape. Do not have throw rugs and other things on the floor that can make you trip. What can I do in the bedroom? Use night lights. Make sure that you have a light by your bed that is easy to reach. Do not use any sheets or blankets that are too big for your bed. They should not hang down onto the floor. Have a firm chair that has side arms. You can  use this for support while you get dressed. Do not have throw rugs and other things on the floor that can make you trip. What can I do in the kitchen? Clean up any spills right away. Avoid walking on wet floors. Keep items that you use a lot in easy-to-reach places. If you need to reach something above you, use a strong step stool that has a grab bar. Keep electrical cords out of the way. Do not use floor polish or wax that makes floors slippery. If you must use wax, use non-skid floor wax. Do not have throw rugs and other things on the floor that can make you trip. What can I do with my stairs? Do not leave any items on the stairs. Make sure that there are handrails on both sides of the stairs and use them. Fix handrails that are broken or loose. Make sure that handrails are as long as the stairways. Check any carpeting to make sure that it is firmly attached to the stairs. Fix any carpet that is loose or worn. Avoid having throw rugs at the top or bottom of the stairs. If you do have throw rugs, attach them to the floor with carpet tape. Make sure that you have a light switch at the top of the stairs  and the bottom of the stairs. If you do not have them, ask someone to add them for you. What else can I do to help prevent falls? Wear shoes that: Do not have high heels. Have rubber bottoms. Are comfortable and fit you well. Are closed at the toe. Do not wear sandals. If you use a stepladder: Make sure that it is fully opened. Do not climb a closed stepladder. Make sure that both sides of the stepladder are locked into place. Ask someone to hold it for you, if possible. Clearly mark and make sure that you can see: Any grab bars or handrails. First and last steps. Where the edge of each step is. Use tools that help you move around (mobility aids) if they are needed. These include: Canes. Walkers. Scooters. Crutches. Turn on the lights when you go into a dark area. Replace any light  bulbs as soon as they burn out. Set up your furniture so you have a clear path. Avoid moving your furniture around. If any of your floors are uneven, fix them. If there are any pets around you, be aware of where they are. Review your medicines with your doctor. Some medicines can make you feel dizzy. This can increase your chance of falling. Ask your doctor what other things that you can do to help prevent falls. This information is not intended to replace advice given to you by your health care provider. Make sure you discuss any questions you have with your health care provider. Document Released: 11/25/2008 Document Revised: 07/07/2015 Document Reviewed: 03/05/2014 Elsevier Interactive Patient Education  2017 [employer].

## 2023-07-19 ENCOUNTER — Encounter: Payer: Self-pay | Admitting: Family Medicine

## 2023-07-19 ENCOUNTER — Ambulatory Visit: Payer: Medicare Other | Admitting: Family Medicine

## 2023-07-19 VITALS — BP 136/86 | HR 72 | Temp 98.3°F | Ht 62.0 in | Wt 157.6 lb

## 2023-07-19 DIAGNOSIS — M8580 Other specified disorders of bone density and structure, unspecified site: Secondary | ICD-10-CM

## 2023-07-19 DIAGNOSIS — I10 Essential (primary) hypertension: Secondary | ICD-10-CM | POA: Diagnosis not present

## 2023-07-19 DIAGNOSIS — N183 Chronic kidney disease, stage 3 unspecified: Secondary | ICD-10-CM | POA: Diagnosis not present

## 2023-07-19 DIAGNOSIS — Z1239 Encounter for other screening for malignant neoplasm of breast: Secondary | ICD-10-CM

## 2023-07-19 DIAGNOSIS — Z0001 Encounter for general adult medical examination with abnormal findings: Secondary | ICD-10-CM | POA: Diagnosis not present

## 2023-07-19 DIAGNOSIS — Z0000 Encounter for general adult medical examination without abnormal findings: Secondary | ICD-10-CM

## 2023-07-19 DIAGNOSIS — D649 Anemia, unspecified: Secondary | ICD-10-CM

## 2023-07-19 MED ORDER — FLUTICASONE PROPIONATE 50 MCG/ACT NA SUSP: 2.0000 | Freq: Every day | NASAL | 6 refills | Status: AC

## 2023-07-19 NOTE — Progress Notes (Signed)
 Subjective:    Patient ID: Gina Levine, female    DOB: 16-May-1950, 73 y.o.   MRN: [id number]  HPI Patient is a very sweet 73 year old African-American female who is here today for complete physical exam.  She denies any problems with falls, memory loss, or depression.  Her blood pressure is just slightly elevated today however the patient was rushing to get here this morning which may be falsely elevating her blood pressure.  She has a blood pressure cuff and can monitor it at home.  She is due for the tetanus shot.  She is also due for mammogram and bone density test.  She has a history of osteopenia.  She recently had a colonoscopy in February which showed 2 polyps.  They recommended another colonoscopy at age 28 so they have decided not to pursue further colonoscopies.  She also reports a stinging pain inside her left nostril.  It is an electrical burning sharp pain approximately three quarters of an inch to half an inch inside her left nostril.  On examination today there is no visible abnormality.  Specifically there is no abscess or polyp Immunization History  Administered Date(s) Administered   Fluad Quad(high Dose 65+) 11/05/2018, 11/25/2019, 11/15/2020   Fluad Trivalent(High Dose 65+) 11/12/2022   Influenza,inj,Quad PF,6+ Mos 11/19/2012, 12/01/2013, 11/24/2015, 12/11/2016, 11/29/2017   Moderna Covid-19 Vaccine Bivalent Booster 85yrs & up 11/22/2020   Moderna Sars-Covid-2 Vaccination 03/18/2019, 04/15/2019, 12/22/2019, 05/31/2020   Pneumococcal Conjugate-13 04/14/2015   Pneumococcal Polysaccharide-23 03/19/2016   Respiratory Syncytial Virus Vaccine,Recomb Aduvanted(Arexvy) 10/17/2021   Tdap 11/19/2012   Zoster Recombinant(Shingrix) 02/24/2021, 08/04/2021    Past Medical History:  Diagnosis Date   Anemia    Depression    Hypertension    Nephrolithiasis    Osteopenia 2017   T=-1.7   Past Surgical History:  Procedure Laterality Date   LITHOTRIPSY     x 2   Current  Outpatient Medications on File Prior to Visit  Medication Sig Dispense Refill   amLODipine  (NORVASC ) 10 MG tablet Take 1 tablet (10 mg total) by mouth daily. 90 tablet 1   cholecalciferol (VITAMIN D ) 1000 UNITS tablet Take 1,000 Units by mouth daily. (Patient not taking: Reported on 04/10/2023)     famotidine  (PEPCID ) 20 MG tablet Take 1 tablet (20 mg total) by mouth 2 (two) times daily. 180 tablet 0   ferrous sulfate 325 (65 FE) MG EC tablet Take 325 mg by mouth daily with breakfast.     hydrochlorothiazide  (HYDRODIURIL ) 25 MG tablet TAKE 1 TABLET (25 MG TOTAL) BY MOUTH DAILY. 90 tablet 0   losartan  (COZAAR ) 50 MG tablet TAKE 1 TABLET BY MOUTH EVERY DAY 90 tablet 1   omeprazole  (PRILOSEC) 20 MG capsule TAKE 1 CAPSULE BY MOUTH EVERY DAY 90 capsule 1   No current facility-administered medications on file prior to visit.   Allergies  Allergen Reactions   Benazepril Other (See Comments)    "makes toes swell"    Social History   Socioeconomic History   Marital status: Widowed    Spouse name: Not on file   Number of children: 4   Years of education: Not on file   Highest education level: Not on file  Occupational History   Occupation: Retired  Tobacco Use   Smoking status: Never   Smokeless tobacco: Never  Substance and Sexual Activity   Alcohol use: No   Drug use: No   Sexual activity: Not Currently  Other Topics Concern   Not on  file  Social History Narrative   Lost sister in [place] 2023.   20 grandchildren.   Social Drivers of Corporate investment banker Strain: Low Risk  (07/18/2023)   Overall Financial Resource Strain (CARDIA)    Difficulty of Paying Living Expenses: Not hard at all  Food Insecurity: No Food Insecurity (07/18/2023)   Hunger Vital Sign    Worried About Running Out of Food in the Last Year: Never true    Ran Out of Food in the Last Year: Never true  Transportation Needs: No Transportation Needs (07/18/2023)   PRAPARE - Administrator, Civil Service  (Medical): No    Lack of Transportation (Non-Medical): No  Physical Activity: Sufficiently Active (07/18/2023)   Exercise Vital Sign    Days of Exercise per Week: 4 days    Minutes of Exercise per Session: 40 min  Stress: No Stress Concern Present (07/18/2023)   [employer] of Occupational Health - Occupational Stress Questionnaire    Feeling of Stress : Not at all  Social Connections: Unknown (07/18/2023)   Social Connection and Isolation Panel [NHANES]    Frequency of Communication with Friends and Family: More than three times a week    Frequency of Social Gatherings with Friends and Family: More than three times a week    Attends Religious Services: Patient unable to answer    Active Member of Clubs or Organizations: Not on file    Attends Banker Meetings: More than 4 times per year    Marital Status: Widowed  Intimate Partner Violence: Not At Risk (07/18/2023)   Humiliation, Afraid, Rape, and Kick questionnaire    Fear of Current or Ex-Partner: No    Emotionally Abused: No    Physically Abused: No    Sexually Abused: No   Family History  Problem Relation Age of Onset   Cerebrovascular Accident Other    Colon cancer Neg Hx    Rectal cancer Neg Hx    Stomach cancer Neg Hx       Review of Systems  All other systems reviewed and are negative.      Objective:   Physical Exam Vitals reviewed.  Constitutional:      General: She is not in acute distress.    Appearance: She is well-developed. She is not diaphoretic.  HENT:     Head: Normocephalic and atraumatic.     Right Ear: External ear normal.     Left Ear: External ear normal.     Nose: Nose normal.     Mouth/Throat:     Pharynx: No oropharyngeal exudate.  Eyes:     General: No scleral icterus.       Right eye: No discharge.        Left eye: No discharge.     Conjunctiva/sclera: Conjunctivae normal.     Pupils: Pupils are equal, round, and reactive to light.  Neck:     Thyroid: No thyromegaly.      Vascular: No JVD.     Trachea: No tracheal deviation.  Cardiovascular:     Rate and Rhythm: Normal rate and regular rhythm.     Heart sounds: Normal heart sounds. No murmur heard.    No friction rub. No gallop.  Pulmonary:     Effort: Pulmonary effort is normal. No respiratory distress.     Breath sounds: Normal breath sounds. No stridor. No wheezing or rales.  Chest:     Chest wall: No tenderness.  Abdominal:  General: Bowel sounds are normal. There is no distension.     Palpations: Abdomen is soft. There is no mass.     Tenderness: There is no abdominal tenderness. There is no guarding or rebound.  Musculoskeletal:        General: No tenderness or deformity.     Cervical back: Normal range of motion and neck supple.     Right knee: Effusion and crepitus present. Decreased range of motion.     Left knee: No crepitus. Decreased range of motion.  Lymphadenopathy:     Cervical: No cervical adenopathy.  Skin:    General: Skin is warm.     Coloration: Skin is not pale.     Findings: No erythema or rash.  Neurological:     Mental Status: She is alert and oriented to person, place, and time.     Cranial Nerves: No cranial nerve deficit.     Motor: No abnormal muscle tone.     Coordination: Coordination normal.     Deep Tendon Reflexes: Reflexes are normal and symmetric.  Psychiatric:        Behavior: Behavior normal.        Thought Content: Thought content normal.        Judgment: Judgment normal.           Assessment & Plan:  Benign essential HTN - Plan: CBC with Differential/Platelet, Comprehensive metabolic panel with GFR, Lipid panel, Iron, Vitamin B12, TSH  Stage 3 chronic kidney disease, unspecified whether stage 3a or 3b CKD (HCC) - Plan: CBC with Differential/Platelet, Comprehensive metabolic panel with GFR, Lipid panel, Iron, Vitamin B12, TSH  Osteopenia, unspecified location - Plan: CBC with Differential/Platelet, Comprehensive metabolic panel with GFR,  Lipid panel, Iron, Vitamin B12, TSH, DG Bone Density, CANCELED: DG Bone Density  General medical exam - Plan: CBC with Differential/Platelet, Comprehensive metabolic panel with GFR, Lipid panel, Iron, Vitamin B12, TSH  Anemia, unspecified type - Plan: CBC with Differential/Platelet, Comprehensive metabolic panel with GFR, Lipid panel, Iron, Vitamin B12, TSH  Encounter for screening for malignant neoplasm of breast, unspecified screening modality - Plan: MM Digital Screening, CANCELED: MM Digital Screening Blood pressure is slightly elevated.  Have asked the patient to monitor her blood pressure at home and notify me if it is greater than 140/90.  She is due for a mammogram and a bone density test which we will schedule for the patient.  I will check a CBC CMP lipid panel.  Given her chronic history of anemia I will also check an iron level and a B12 level along with a TSH.  Patient declines a tetanus shot today.  Colonoscopy and Pap smear are not required at the present time.  We will try Flonase 2 sprays in the left nostril daily to treat what may be nerve irritation based on her description.  I do not visibly see any abnormality.  Patient can notify me if pain is not improving over 1 to 2 weeks

## 2023-07-20 LAB — CBC WITH DIFFERENTIAL/PLATELET
Absolute Lymphocytes: 2218 {cells}/uL (ref 850–3900)
Absolute Monocytes: 461 {cells}/uL (ref 200–950)
Basophils Absolute: 50 {cells}/uL (ref 0–200)
Basophils Relative: 0.7 %
Eosinophils Absolute: 893 {cells}/uL — ABNORMAL HIGH (ref 15–500)
Eosinophils Relative: 12.4 %
HCT: 32.4 % — ABNORMAL LOW (ref 35.0–45.0)
Hemoglobin: 9.9 g/dL — ABNORMAL LOW (ref 11.7–15.5)
MCH: 30.5 pg (ref 27.0–33.0)
MCHC: 30.6 g/dL — ABNORMAL LOW (ref 32.0–36.0)
MCV: 99.7 fL (ref 80.0–100.0)
MPV: 12.2 fL (ref 7.5–12.5)
Monocytes Relative: 6.4 %
Neutro Abs: 3578 {cells}/uL (ref 1500–7800)
Neutrophils Relative %: 49.7 %
Platelets: 245 10*3/uL (ref 140–400)
RBC: 3.25 10*6/uL — ABNORMAL LOW (ref 3.80–5.10)
RDW: 11.5 % (ref 11.0–15.0)
Total Lymphocyte: 30.8 %
WBC: 7.2 10*3/uL (ref 3.8–10.8)

## 2023-07-20 LAB — COMPREHENSIVE METABOLIC PANEL WITH GFR
AG Ratio: 1.7 (calc) (ref 1.0–2.5)
ALT: 13 U/L (ref 6–29)
AST: 13 U/L (ref 10–35)
Albumin: 4.6 g/dL (ref 3.6–5.1)
Alkaline phosphatase (APISO): 60 U/L (ref 37–153)
BUN/Creatinine Ratio: 17 (calc) (ref 6–22)
BUN: 24 mg/dL (ref 7–25)
CO2: 26 mmol/L (ref 20–32)
Calcium: 9.7 mg/dL (ref 8.6–10.4)
Chloride: 105 mmol/L (ref 98–110)
Creat: 1.45 mg/dL — ABNORMAL HIGH (ref 0.60–1.00)
Globulin: 2.7 g/dL (ref 1.9–3.7)
Glucose, Bld: 84 mg/dL (ref 65–99)
Potassium: 4.7 mmol/L (ref 3.5–5.3)
Sodium: 138 mmol/L (ref 135–146)
Total Bilirubin: 0.6 mg/dL (ref 0.2–1.2)
Total Protein: 7.3 g/dL (ref 6.1–8.1)
eGFR: 38 mL/min/{1.73_m2} — ABNORMAL LOW (ref >=60)

## 2023-07-20 LAB — LIPID PANEL
Cholesterol: 204 mg/dL — ABNORMAL HIGH (ref <200)
HDL: 61 mg/dL (ref >=50)
LDL Cholesterol (Calc): 119 mg/dL — ABNORMAL HIGH
Non-HDL Cholesterol (Calc): 143 mg/dL — ABNORMAL HIGH (ref <130)
Total CHOL/HDL Ratio: 3.3 (calc) (ref <5.0)
Triglycerides: 127 mg/dL (ref <150)

## 2023-07-20 LAB — TSH: TSH: 1.22 m[IU]/L (ref 0.40–4.50)

## 2023-07-20 LAB — VITAMIN B12: Vitamin B-12: 684 pg/mL (ref 200–1100)

## 2023-07-20 LAB — IRON: Iron: 85 ug/dL (ref 45–160)

## 2023-07-22 ENCOUNTER — Ambulatory Visit: Payer: Self-pay | Admitting: Family Medicine

## 2023-08-26 LAB — HM MAMMOGRAPHY

## 2023-08-27 ENCOUNTER — Ambulatory Visit (HOSPITAL_COMMUNITY)
Admission: RE | Admit: 2023-08-27 | Discharge: 2023-08-27 | Disposition: A | Discharge status: Home / Self Care | Source: Ambulatory Visit | Attending: Family Medicine | Admitting: Family Medicine

## 2023-08-27 DIAGNOSIS — M8580 Other specified disorders of bone density and structure, unspecified site: Secondary | ICD-10-CM | POA: Insufficient documentation

## 2023-08-27 DIAGNOSIS — Z13820 Encounter for screening for osteoporosis: Secondary | ICD-10-CM | POA: Diagnosis not present

## 2023-08-27 DIAGNOSIS — M8589 Other specified disorders of bone density and structure, multiple sites: Secondary | ICD-10-CM | POA: Insufficient documentation

## 2023-09-18 ENCOUNTER — Other Ambulatory Visit: Payer: Self-pay | Admitting: Family Medicine

## 2023-09-19 ENCOUNTER — Telehealth: Payer: Self-pay | Admitting: Family Medicine

## 2023-09-19 ENCOUNTER — Other Ambulatory Visit: Payer: Self-pay

## 2023-09-19 DIAGNOSIS — I10 Essential (primary) hypertension: Secondary | ICD-10-CM

## 2023-09-19 MED ORDER — HYDROCHLOROTHIAZIDE 25 MG PO TABS: 25.0000 mg | ORAL_TABLET | Freq: Every day | ORAL | 1 refills | Status: AC

## 2023-09-19 NOTE — Telephone Encounter (Signed)
 Prescription Request  09/19/2023  LOV: 07/19/2023  What is the name of the medication or equipment?   hydrochlorothiazide  (HYDRODIURIL ) 25 MG tablet  **90 day script request**  Have you contacted your pharmacy to request a refill? Yes   Which pharmacy would you like this sent to?  [id number] [place], [place] - [address] [place] [address] Phone: [phone] Fax: [phone]    Patient notified that their request is being sent to the clinical staff for review and that they should receive a response within 2 business days.   Please advise pharmacist.

## 2023-11-11 ENCOUNTER — Telehealth: Payer: Self-pay

## 2023-11-11 NOTE — Telephone Encounter (Signed)
 Copied from CRM [id number]. Topic: Clinical - Medication Question >> Nov 11, 2023 12:26 PM Carlatta H wrote: Reason for CRM: Patient would like covid shot prescription sent to [id number] [place], [place] - [address] [place] [address] Phone: [phone] Fax: [phone] Hours: Not open 24 hours

## 2023-11-12 ENCOUNTER — Ambulatory Visit (INDEPENDENT_AMBULATORY_CARE_PROVIDER_SITE_OTHER)

## 2023-11-12 DIAGNOSIS — Z23 Encounter for immunization: Secondary | ICD-10-CM

## 2023-11-12 NOTE — Progress Notes (Signed)
 Patient is in office today for a nurse visit for Immunization. Patient Injection was given in the  Right deltoid. Patient tolerated injection well.

## 2023-11-18 ENCOUNTER — Other Ambulatory Visit: Payer: Self-pay

## 2023-11-18 NOTE — Telephone Encounter (Signed)
 Prescription Request  11/18/2023  LOV: 07/19/23  What is the name of the medication or equipment? omeprazole  (PRILOSEC) 20 MG capsule [[id number]]   Have you contacted your pharmacy to request a refill? Yes   Which pharmacy would you like this sent to?  [id number] [place], [place] - [address] [place] [address] Phone: [phone] Fax: [phone]    Patient notified that their request is being sent to the clinical staff for review and that they should receive a response within 2 business days.   Please advise at [facility] [phone]

## 2023-11-19 MED ORDER — OMEPRAZOLE 20 MG PO CPDR: 20.0000 mg | DELAYED_RELEASE_CAPSULE | Freq: Every day | ORAL | 1 refills | Status: AC

## 2023-11-19 NOTE — Telephone Encounter (Signed)
 Requested Prescriptions  Pending Prescriptions Disp Refills   omeprazole  (PRILOSEC) 20 MG capsule 90 capsule 1    Sig: Take 1 capsule (20 mg total) by mouth daily.     Gastroenterology: Proton Pump Inhibitors Passed - 11/19/2023  2:05 PM      Passed - Valid encounter within last 12 months    Recent Outpatient Visits           4 months ago Benign essential HTN   [facility] Pickard, Butler DASEN, MD   1 year ago Benign essential HTN   [place] [facility] Pickard, Butler DASEN, MD   1 year ago Osteopenia, unspecified location   [facility] Pickard, Butler DASEN, MD

## 2023-12-08 ENCOUNTER — Other Ambulatory Visit: Payer: Self-pay | Admitting: Family Medicine

## 2023-12-09 ENCOUNTER — Other Ambulatory Visit: Payer: Self-pay

## 2023-12-09 DIAGNOSIS — N183 Chronic kidney disease, stage 3 unspecified: Secondary | ICD-10-CM

## 2023-12-09 DIAGNOSIS — I10 Essential (primary) hypertension: Secondary | ICD-10-CM

## 2023-12-09 NOTE — Telephone Encounter (Signed)
 Prescription Request  12/09/2023  LOV: 07/19/23  What is the name of the medication or equipment? famotidine  (PEPCID ) 20 MG tablet [[id number]]   Have you contacted your pharmacy to request a refill? Yes   Which pharmacy would you like this sent to?  [id number] [place], [place] - [address] [place] [address] Phone: [phone] Fax: [phone]    Patient notified that their request is being sent to the clinical staff for review and that they should receive a response within 2 business days.   Please advise at [facility] [phone]  Prescription Request  12/09/2023  LOV: 07/19/23  What is the name of the medication or equipment? losartan  (COZAAR ) 50 MG tablet [[id number]]   Have you contacted your pharmacy to request a refill? Yes   Which pharmacy would you like this sent to?  [id number] [place], [place] - [address] [place] [address] Phone: [phone] Fax: [phone]    Patient notified that their request is being sent to the clinical staff for review and that they should receive a response within 2 business days.   Please advise at [facility] [phone]

## 2023-12-10 MED ORDER — LOSARTAN POTASSIUM 50 MG PO TABS: 50.0000 mg | ORAL_TABLET | Freq: Every day | ORAL | 0 refills | Status: DC

## 2023-12-10 MED ORDER — FAMOTIDINE 20 MG PO TABS: 20.0000 mg | ORAL_TABLET | Freq: Two times a day (BID) | ORAL | 0 refills | Status: DC

## 2023-12-10 NOTE — Telephone Encounter (Signed)
 Requested Prescriptions  Pending Prescriptions Disp Refills   famotidine  (PEPCID ) 20 MG tablet 180 tablet 0    Sig: Take 1 tablet (20 mg total) by mouth 2 (two) times daily.     Gastroenterology:  H2 Antagonists Passed - 12/10/2023  4:15 PM      Passed - Valid encounter within last 12 months    Recent Outpatient Visits           4 months ago Benign essential HTN   [facility] Pickard, Butler DASEN, MD   1 year ago Benign essential HTN   [facility] Pickard, Butler DASEN, MD   1 year ago Osteopenia, unspecified location   [facility] Pickard, Butler DASEN, MD               losartan  (COZAAR ) 50 MG tablet 90 tablet 0    Sig: Take 1 tablet (50 mg total) by mouth daily.     Cardiovascular:  Angiotensin Receptor Blockers Failed - 12/10/2023  4:15 PM      Failed - Cr in normal range and within 180 days    Creat  Date Value Ref Range Status  07/19/2023 1.45 (H) 0.60 - 1.00 mg/dL Final   Creatinine, Urine  Date Value Ref Range Status  11/12/2022 168 20 - 275 mg/dL Final         Passed - K in normal range and within 180 days    Potassium  Date Value Ref Range Status  07/19/2023 4.7 3.5 - 5.3 mmol/L Final         Passed - Patient is not pregnant      Passed - Last BP in normal range    BP Readings from Last 1 Encounters:  07/19/23 136/86         Passed - Valid encounter within last 6 months    Recent Outpatient Visits           4 months ago Benign essential HTN   [facility] [facility] Duanne Butler DASEN, MD   1 year ago Benign essential HTN   [facility] Pickard, Butler DASEN, MD   1 year ago Osteopenia, unspecified location   [place] [facility] Pickard, Butler DASEN, MD

## 2023-12-10 NOTE — Telephone Encounter (Signed)
 Requested Prescriptions  Refused Prescriptions Disp Refills   fluticasone  (FLONASE ) 50 MCG/ACT nasal spray [Pharmacy Med Name: FLUTICASONE  PROP 50 MCG SPRAY] 48 mL 2    Sig: SPRAY 2 SPRAYS INTO EACH NOSTRIL EVERY DAY     Ear, Nose, and Throat: Nasal Preparations - Corticosteroids Passed - 12/10/2023 10:37 AM      Passed - Valid encounter within last 12 months    Recent Outpatient Visits           4 months ago Benign essential HTN   [facility] Pickard, Butler DASEN, MD   1 year ago Benign essential HTN   [place] [facility] Pickard, Butler DASEN, MD   1 year ago Osteopenia, unspecified location   [facility] Pickard, Butler DASEN, MD

## 2023-12-13 ENCOUNTER — Other Ambulatory Visit: Payer: Self-pay | Admitting: Family Medicine

## 2023-12-13 DIAGNOSIS — I10 Essential (primary) hypertension: Secondary | ICD-10-CM

## 2023-12-13 DIAGNOSIS — N183 Chronic kidney disease, stage 3 unspecified: Secondary | ICD-10-CM

## 2024-01-21 ENCOUNTER — Telehealth: Payer: Self-pay

## 2024-01-21 NOTE — Telephone Encounter (Unsigned)
 Copied from CRM #[id number]. Topic: Clinical - Medication Question >> Jan 21, 2024  4:32 PM Nessti S wrote: Reason for CRM: pt called in because she was told that it was a recall on losartan  (COZAAR ) 50 MG tablet. She wanted to know if it is safe to take. Call back number [phone] (M)

## 2024-03-07 ENCOUNTER — Other Ambulatory Visit: Payer: Self-pay | Admitting: Family Medicine

## 2024-03-07 DIAGNOSIS — I10 Essential (primary) hypertension: Secondary | ICD-10-CM

## 2024-03-07 DIAGNOSIS — N183 Chronic kidney disease, stage 3 unspecified: Secondary | ICD-10-CM

## 2024-03-09 NOTE — Telephone Encounter (Signed)
 Requested Prescriptions  Pending Prescriptions Disp Refills   losartan  (COZAAR ) 50 MG tablet [Pharmacy Med Name: LOSARTAN  POTASSIUM 50 MG TAB] 90 tablet 0    Sig: TAKE 1 TABLET BY MOUTH EVERY DAY     Cardiovascular:  Angiotensin Receptor Blockers Failed - 03/09/2024 12:28 PM      Failed - Cr in normal range and within 180 days    Creat  Date Value Ref Range Status  07/19/2023 1.45 (H) 0.60 - 1.00 mg/dL Final   Creatinine, Urine  Date Value Ref Range Status  11/12/2022 168 20 - 275 mg/dL Final         Failed - K in normal range and within 180 days    Potassium  Date Value Ref Range Status  07/19/2023 4.7 3.5 - 5.3 mmol/L Final         Failed - Valid encounter within last 6 months    Recent Outpatient Visits           7 months ago Benign essential HTN   [facility] Duanne Butler DASEN, MD   1 year ago Benign essential HTN   [facility] Pickard, Butler DASEN, MD   2 years ago Osteopenia, unspecified location   [facility] Pickard, Butler DASEN, MD              Passed - Patient is not pregnant      Passed - Last BP in normal range    BP Readings from Last 1 Encounters:  07/19/23 136/86          famotidine  (PEPCID ) 20 MG tablet [Pharmacy Med Name: FAMOTIDINE  20 MG TABLET] 180 tablet 0    Sig: TAKE 1 TABLET BY MOUTH TWICE A DAY     Gastroenterology:  H2 Antagonists Passed - 03/09/2024 12:28 PM      Passed - Valid encounter within last 12 months    Recent Outpatient Visits           7 months ago Benign essential HTN   [facility] Pickard, Butler DASEN, MD   1 year ago Benign essential HTN   [facility] Pickard, Butler DASEN, MD   2 years ago Osteopenia, unspecified location   [facility] Pickard, Butler DASEN, MD

## 2024-07-21 ENCOUNTER — Encounter: Admitting: Family Medicine

## 2024-07-23 ENCOUNTER — Encounter
